# Patient Record
Sex: Female | Born: 1980 | Race: White | Hispanic: No | Marital: Married | Smoking: Never smoker
Health system: Southern US, Community
[De-identification: ages and names within clinical notes are randomized; demographics above are authoritative.]

## PROBLEM LIST (undated history)

## (undated) DIAGNOSIS — R51 Headache: Secondary | ICD-10-CM

## (undated) DIAGNOSIS — Z9889 Other specified postprocedural states: Secondary | ICD-10-CM

## (undated) DIAGNOSIS — Z973 Presence of spectacles and contact lenses: Secondary | ICD-10-CM

## (undated) DIAGNOSIS — B019 Varicella without complication: Secondary | ICD-10-CM

## (undated) DIAGNOSIS — B029 Zoster without complications: Secondary | ICD-10-CM

## (undated) DIAGNOSIS — N83201 Unspecified ovarian cyst, right side: Secondary | ICD-10-CM

## (undated) DIAGNOSIS — R112 Nausea with vomiting, unspecified: Secondary | ICD-10-CM

## (undated) DIAGNOSIS — R87619 Unspecified abnormal cytological findings in specimens from cervix uteri: Secondary | ICD-10-CM

## (undated) DIAGNOSIS — I7300 Raynaud's syndrome without gangrene: Secondary | ICD-10-CM

## (undated) DIAGNOSIS — M1990 Unspecified osteoarthritis, unspecified site: Secondary | ICD-10-CM

## (undated) DIAGNOSIS — R519 Headache, unspecified: Secondary | ICD-10-CM

## (undated) DIAGNOSIS — D649 Anemia, unspecified: Secondary | ICD-10-CM

## (undated) DIAGNOSIS — IMO0002 Reserved for concepts with insufficient information to code with codable children: Secondary | ICD-10-CM

## (undated) DIAGNOSIS — Z8719 Personal history of other diseases of the digestive system: Secondary | ICD-10-CM

## (undated) HISTORY — PX: ADENOIDECTOMY: SUR15

## (undated) HISTORY — DX: Raynaud's syndrome without gangrene: I73.00

## (undated) HISTORY — PX: WISDOM TOOTH EXTRACTION: SHX21

## (undated) HISTORY — PX: ABDOMINAL HYSTERECTOMY: SHX81

## (undated) HISTORY — PX: KNEE SURGERY: SHX244

## (undated) HISTORY — PX: ARTHROSCOPIC REPAIR ACL: SUR80

---
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
## 2002-11-11 ENCOUNTER — Other Ambulatory Visit: Admission: RE | Admit: 2002-11-11 | Discharge: 2002-11-11 | Payer: Self-pay | Admitting: Obstetrics and Gynecology

## 2003-12-19 ENCOUNTER — Other Ambulatory Visit: Admission: RE | Admit: 2003-12-19 | Discharge: 2003-12-19 | Payer: Self-pay | Admitting: Obstetrics and Gynecology

## 2004-12-19 ENCOUNTER — Other Ambulatory Visit: Admission: RE | Admit: 2004-12-19 | Discharge: 2004-12-19 | Payer: Self-pay | Admitting: Obstetrics and Gynecology

## 2005-05-23 ENCOUNTER — Other Ambulatory Visit: Admission: RE | Admit: 2005-05-23 | Discharge: 2005-05-23 | Payer: Self-pay

## 2005-12-20 ENCOUNTER — Other Ambulatory Visit: Admission: RE | Admit: 2005-12-20 | Discharge: 2005-12-20 | Payer: Self-pay | Admitting: Obstetrics and Gynecology

## 2006-03-25 HISTORY — PX: DILATION AND CURETTAGE OF UTERUS: SHX78

## 2006-10-26 ENCOUNTER — Ambulatory Visit (HOSPITAL_COMMUNITY): Admission: RE | Admit: 2006-10-26 | Discharge: 2006-10-26 | Payer: Self-pay | Admitting: Obstetrics and Gynecology

## 2007-01-08 ENCOUNTER — Ambulatory Visit (HOSPITAL_COMMUNITY): Admission: RE | Admit: 2007-01-08 | Discharge: 2007-01-08 | Payer: Self-pay | Admitting: Obstetrics and Gynecology

## 2007-01-08 ENCOUNTER — Encounter (INDEPENDENT_AMBULATORY_CARE_PROVIDER_SITE_OTHER): Payer: Self-pay | Admitting: Obstetrics and Gynecology

## 2007-02-20 ENCOUNTER — Ambulatory Visit (HOSPITAL_COMMUNITY): Admission: RE | Admit: 2007-02-20 | Discharge: 2007-02-20 | Payer: Self-pay | Admitting: Obstetrics and Gynecology

## 2007-05-09 ENCOUNTER — Inpatient Hospital Stay (HOSPITAL_COMMUNITY): Admission: RE | Admit: 2007-05-09 | Discharge: 2007-05-09 | Payer: Self-pay | Admitting: Obstetrics and Gynecology

## 2008-01-13 ENCOUNTER — Inpatient Hospital Stay (HOSPITAL_COMMUNITY): Admission: AD | Admit: 2008-01-13 | Discharge: 2008-01-13 | Payer: Self-pay | Admitting: Obstetrics and Gynecology

## 2008-01-15 ENCOUNTER — Inpatient Hospital Stay (HOSPITAL_COMMUNITY): Admission: AD | Admit: 2008-01-15 | Discharge: 2008-01-18 | Payer: Self-pay | Admitting: Obstetrics and Gynecology

## 2008-05-09 ENCOUNTER — Encounter: Admission: RE | Admit: 2008-05-09 | Discharge: 2008-07-05 | Payer: Self-pay | Admitting: Specialist

## 2010-03-25 HISTORY — PX: BUNIONECTOMY: SHX129

## 2010-04-15 ENCOUNTER — Encounter: Payer: Self-pay | Admitting: Obstetrics and Gynecology

## 2010-04-30 LAB — HEPATITIS B SURFACE ANTIGEN: Hepatitis B Surface Ag: NEGATIVE

## 2010-08-07 NOTE — H&P (Signed)
Cynthia Chase NO.:  [id number]   MEDICAL RECORD NO.:  [id number]          PATIENT TYPE:  MAT   LOCATION:  MATC                          FAC[facility]:  [facility]   PHYSICIAN:  Cynthia Fat. Chase, M.D. DATE OF BIRTH:  03-21-1981   DATE OF ADMISSION:  01/15/2008  DATE OF DISCHARGE:                              HISTORY & PHYSICAL   PRIORITY ADMISSION HISTORY AND PHYSICAL   Cynthia Chase is a 30 year old, gravida 2, para 0-0-1-0, at 21 and 5/7th  weeks, who presents with a gush of fluid at approximately 6 p.m.  tonight, clear fluid, no uterine contractions are reported, she does  report positive fetal movement.  Pregnancy has been remarkable for:  1)  Positive group B Strep; 2)  History of infertility.   PRENATAL LABORATORY DATA:  Blood type is O-positive, Rh antibody  negative, VDRL nonreactive, Rubella titer positive, hepatitis B surface  antigen negative, HIV nonreactive, toxoplasmosis titers were negative-  negative, GC/chlamydia cultures were declined in the first trimester.  Cystic fibrosis testing was declined.  Pap was normal in October of  2008, Hemoglobin upon entry into practice was 13.1; it was 10.8 at 27  weeks.  Patient had first trimester screening that was normal, AFP was  normal.  She had an elevated 1-hour Glucola at 151.  She had a normal 3-  hour GTT. Group B Strep culture was noted to be positive at 36 weeks.  GC/chlamydia cultures were negative at that time.   HISTORY OF PRESENT PREGNANCY:  The patient entered care at approximately  10 weeks.  She had been on Protease until April 12th, which would have  been approximately right at 10 weeks.  She did want first trimester  screening.  This was done and was normal.  She did have some nausea and  vomiting in early pregnancy and was on Phenergan and Zofran.  General  ultrasound at 18 weeks with a normal growth.  She had previously had a  first trimester ultrasound on March 25th that gave an [facility] of January 31, 2008.  The ultrasound at 18 to 19 weeks was in good agreement.  Glucola was elevated at 151, 3-hour GTT was normal.  She had an NST at  31 to 32 weeks for decreased fetal movement.  She had some vaginal pain  at 33 weeks and was seen in the office.  The cervix was closed and long.  She got the seasonal flu vaccine at approximately 36 weeks.  Group B  Strep culture was positive at that time.  GC/chlamydia cultures were  negative.  She was seen in the office earlier this week and was noted to  be 1 cm and approximately 70%.   OBSTETRICAL HISTORY:  The patient in 2008 had a spontaneous miscarriage  in the first trimester.  SHE IS ALLERGIC TO SULFA.  She does have a  history of irregular cycles.   OTHER MEDICAL HISTORY:  1. Both pregnancies were conceived with Femara.  2. She had chickenpox as a child and had shingles in third grade.  3. She had 1 abnormal Pap in 2006.  Followups were normal.  4. She was on Yasmin until July of 2007.   PAST SURGICAL HISTORY:  1. Adenoids in second grade.  2. She had knee surgery at 21 on the right.  3. Left knee surgery as a 10th grader.  4. Wisdom teeth removed at age 72.   FAMILY HISTORY:  Maternal grandmother had coronary artery bypass graft,  paternal grandmother had BT's, maternal grandfather had lung cancer,  paternal grandfather had pancreatic cancer, paternal grandmother had  breast cancer, paternal uncle had lung cancer, and paternal aunt had  breast cancer.   GENETIC HISTORY:  Unremarkable.   SOCIAL HISTORY:  The patient is married to the father of the baby.  He  is involved and supportive.  His name is Tenia Goh.  The patient is  Caucasian.  She denies any religious affiliation.  She has a Bachelor's  degree.  She is an Advertising account planner.  Her husband also has a Bachelor's  degree.  He is a Furniture conservator/restorer at an elementary school.  She has been followed by the [facility] at [facility].  She denies any  alcohol, drug, or tobacco use during this pregnancy.   PHYSICAL EXAMINATION:  VITAL SIGNS:  Stable.  The patient is afebrile.  HEENT:  Within normal limits.  LUNGS:  Breath sounds are clear.  HEART:  Regular rate and rhythm without murmur.  BREASTS:  Soft and nontender.  ABDOMEN:  Fundal height is approximately 38 cm.  Estimated fetal weight  is 6 to 7 pounds.  Uterine contractions are very occasional and mild.  Fetal heart rate is reactive.  PELVIC EXAM:  The patient is noted to be leaking clear fluid.  Positive  fern, positive Nitrazine, and positive pooling is noted.  Cervix is 1+,  70%, vertex at a minus 1 station with forewaters noted.  EXTREMITIES:  Deep tendon reflexes are 2+ without clonus.  There is a  trace edema noted.   IMPRESSION:  1. Intrauterine pregnancy at 52 and 5/7th weeks.  2. Premature rupture of membranes at term.  3. Positive group B Streptococcus.   PLAN:  1. Admit to [facility] for consult with Dr. [name] as attending      physician.  2. Routine physician orders.  3. Plan Group B Strep prophylaxis with penicillin-G per standard      dosing.  4. Dr. [name] will determine further plan of care.      Cynthia Chase, C.N.M.      Cynthia Chase, M.D.  Electronically Signed    VLL/MEDQ  D:  01/15/2008  T:  01/15/2008  Job:  [id number]

## 2010-08-07 NOTE — Op Note (Signed)
Cynthia Chase, Cynthia Chase              ACCOUNT NO.:  [id number]   MEDICAL RECORD NO.:  [id number]          PATIENT TYPE:  AMB   LOCATION:  SDC                           [facility]:  [facility]   PHYSICIAN:  Crist Fat. Rivard, M.D. DATE OF BIRTH:  October 02, 1980   DATE OF PROCEDURE:  01/08/2007  DATE OF DISCHARGE:                               OPERATIVE REPORT   PREOPERATIVE DIAGNOSIS:  Missed abortion at 12 weeks.   POSTOPERATIVE DIAGNOSIS:  Missed abortion at 12 weeks.   ANESTHESIA:  Intravenous sedation, Dr. Malen Gauze, and paracervical block,  Dr. [name].   PROCEDURE:  Dilatation and evacuation.   SURGEON:  Crist Fat. Rivard, MD   ESTIMATED BLOOD LOSS:  Minimal.   PROCEDURE:  After being informed of the planned procedure and possible  complications including bleeding, infection and injury to uterus,  informed consent is obtained.  The patient is taken to OR #3, given IV  sedation and placed in the lithotomy position.  She is prepped and  draped in a sterile fashion and her bladder is emptied with an in-and-  out Foley catheter.  GYN exam reveals a retroverted uterus compatible  with 8-10 weeks' size, mobile, and two normal adnexa.  A weighted  speculum is inserted.  The anterior lip of the cervix is anesthetized  with 1 mL of Nesacaine 1% and then grasped with a tenaculum forceps.  We  then proceed with paracervical block using a total of 18 mL of Nesacaine  1% in the usual fashion.  The uterus is then sounded at 10.5 cm and the  cervix is dilated using Hegar dilator until #33.  During this process,  the tenaculum forceps is changed for a Jacobs forceps to reduce the risk  of laceration.  Using a curved #9 cannula, we proceed with evacuation of  the uterus, which removes a fair amount of products of conception.  After suction is completed, a sharp curette is used to assess the  cavity, which appears to be free of tissue.  Instruments are then  removed instrument, instrument and sponge counts  complete x2.  Estimated  blood loss is minimal and the procedure is very well tolerated by the  patient, who is taken to the recovery room in a well and stable  condition.   SPECIMEN:  Products of conception sent to Pathology.      Crist Fat Rivard, M.D.  Electronically Signed     SAR/MEDQ  D:  01/08/2007  T:  01/09/2007  Job:  [id number]

## 2010-08-12 ENCOUNTER — Inpatient Hospital Stay (HOSPITAL_COMMUNITY): Payer: BC Managed Care – PPO

## 2010-08-12 ENCOUNTER — Inpatient Hospital Stay (HOSPITAL_COMMUNITY)
Admission: AD | Admit: 2010-08-12 | Discharge: 2010-08-12 | Disposition: A | Discharge status: Home / Self Care | Payer: BC Managed Care – PPO | Source: Ambulatory Visit | Attending: Obstetrics and Gynecology | Admitting: Obstetrics and Gynecology

## 2010-08-12 DIAGNOSIS — O4690 Antepartum hemorrhage, unspecified, unspecified trimester: Secondary | ICD-10-CM | POA: Insufficient documentation

## 2010-08-12 LAB — URINE MICROSCOPIC-ADD ON

## 2010-08-12 LAB — URINALYSIS, ROUTINE W REFLEX MICROSCOPIC
Bilirubin Urine: NEGATIVE
Leukocytes, UA: NEGATIVE
Nitrite: NEGATIVE
Protein, ur: NEGATIVE mg/dL
Specific Gravity, Urine: 1.01 (ref 1.005–1.030)
Urobilinogen, UA: 0.2 mg/dL (ref 0.0–1.0)

## 2010-08-13 ENCOUNTER — Inpatient Hospital Stay (HOSPITAL_COMMUNITY): Payer: BC Managed Care – PPO

## 2010-08-13 ENCOUNTER — Inpatient Hospital Stay (HOSPITAL_COMMUNITY)
Admission: AD | Admit: 2010-08-13 | Discharge: 2010-08-18 | DRG: 886 | Disposition: A | Discharge status: Home / Self Care | Payer: BC Managed Care – PPO | Source: Ambulatory Visit | Attending: Obstetrics and Gynecology | Admitting: Obstetrics and Gynecology

## 2010-08-13 DIAGNOSIS — O4690 Antepartum hemorrhage, unspecified, unspecified trimester: Secondary | ICD-10-CM | POA: Diagnosis present

## 2010-08-13 DIAGNOSIS — O209 Hemorrhage in early pregnancy, unspecified: Secondary | ICD-10-CM

## 2010-08-13 DIAGNOSIS — O4590 Premature separation of placenta, unspecified, unspecified trimester: Principal | ICD-10-CM | POA: Diagnosis present

## 2010-08-13 LAB — WET PREP, GENITAL
Clue Cells Wet Prep HPF POC: NONE SEEN
Trich, Wet Prep: NONE SEEN
Yeast Wet Prep HPF POC: NONE SEEN

## 2010-08-13 LAB — URINE MICROSCOPIC-ADD ON

## 2010-08-13 LAB — URINALYSIS, ROUTINE W REFLEX MICROSCOPIC
Ketones, ur: NEGATIVE mg/dL
Leukocytes, UA: NEGATIVE

## 2010-08-13 LAB — CBC
Hemoglobin: 11.8 g/dL — ABNORMAL LOW (ref 12.0–15.0)
RDW: 12.4 % (ref 11.5–15.5)
WBC: 11.3 10*3/uL — ABNORMAL HIGH (ref 4.0–10.5)

## 2010-08-14 LAB — GC/CHLAMYDIA PROBE AMP, GENITAL
Chlamydia, DNA Probe: NEGATIVE
GC Probe Amp, Genital: NEGATIVE

## 2010-08-15 ENCOUNTER — Observation Stay (HOSPITAL_COMMUNITY)
Admit: 2010-08-15 | Discharge: 2010-08-15 | Disposition: A | Discharge status: Home / Self Care | Payer: BC Managed Care – PPO | Attending: Obstetrics and Gynecology | Admitting: Obstetrics and Gynecology

## 2010-08-17 LAB — COMPREHENSIVE METABOLIC PANEL
AST: 11 U/L (ref 0–37)
BUN: 8 mg/dL (ref 6–23)
CO2: 26 mEq/L (ref 19–32)
Calcium: 9.5 mg/dL (ref 8.4–10.5)
Chloride: 97 mEq/L (ref 96–112)
Glucose, Bld: 103 mg/dL — ABNORMAL HIGH (ref 70–99)
Potassium: 3.3 mEq/L — ABNORMAL LOW (ref 3.5–5.1)
Total Protein: 6.6 g/dL (ref 6.0–8.3)

## 2010-09-03 NOTE — Discharge Summary (Signed)
NAMEMORGIN, HALLS NO.:  [id number]  MEDICAL RECORD NO.:  [id number]           PATIENT TYPE:  I  LOCATION:  9152                          [facility]:  [facility]  PHYSICIAN:  Janine Limbo, M.D.DATE OF BIRTH:  10/25/1980  DATE OF ADMISSION:  08/13/2010 DATE OF DISCHARGE:  08/18/2010                              DISCHARGE SUMMARY   ADMITTING DIAGNOSES: 1. 25-week singleton intrauterine pregnancy 2. Vaginal bleeding of unknown etiology  3. Suspected Marginal Abruption 4. Fetal heart rate category 1  DISCHARGE DIAGNOSES: 1. 25-week singleton intrauterine pregnancy 2. Vaginal bleeding of unknown etiology  3. Suspected Marginal Abruption 4. Fetal heart rate category 1   HOSPITAL PROCEDURES:  Ultrasound and Maternal Fetal Medicine consult.  HOSPITAL COURSE:  Ms. Bayless presented to the MAU on Aug 13, 2010, at 8:20pm with chief complaints of a gush of purple-red blood late in the afternoon, which saturated her underwear and clothes.  She had been evaluated for vaginal bleeding the previous day in the MAU with similar complaints of a gush that had tapered as the day progressed to intermittent spotting.  After leaving MAU on the previous day, she had intermittent spotting through the night and then all through the day until she had a gush in the afternoon and called the office where she was instructed to be seen in the MAU. She also reported an occasional contractions since Friday, very mild lower abdominal pressure, and some upper abdominal tightening.  She had no fever, no illness, no respiratory complaints, no urinary tract infection signs or symptoms or PIH signs or symptoms.  On exam she was found to have a fair amount of bright red vaginal bleeding in the vaginal vault.  Swabs were used to clear the vault to visualize the cervix, and the cervix was noted to have small trickling of active vaginal bleeding.  At that point, she was admitted to the antepartum unit  for a  complete OB Ultrasound, IV fluids, a wet prep, cultures, continuous fetal monitoring, pad counts, and to remain NPO status. At 2330 Dr. [name] reviewed her ultrasound and found it to be essentially  normal, but could be consistent, clinically, with a marginal abruption.  Findings were reviewed with the patient at that time as well as the plan of care.  She was admitted fully to the AP unit and betamethasone was ordered q.12 hrs IM as well as continuous fetal monitoring.  On the morning of Aug 14, 2010, she was noted to be resting comfortably in bed.  She denied any abdominal pain or discomfort.  Her H and H was stable at 13.6 and 40.6.  She had no active bleeding x12 hours, had a pad on which had some small brown discharge on it.  She was afebrile.  Her abdomen was soft and nontender.  Fetal heart rate was category 1 in 140s, reactive, with a very active baby and we continued to monitor her closely.  She was status post one dose of betamethasone at that time due for a second dose at 12:35 that day.    On the morning of Aug 15, 2010, she was  still found to be doing well.  She had had a small amount of discharge after a bowel movement, but no active bleeding.  Her Vital signs were stable and she remained afebrile.  The Fetal heart rate was appropriate for gestational age of [redacted] weeks. A Maternal Fetal Medicine consult was planned to check a repeat ultrasound.  On the afternoon of May 23rd,  Maternal Fetal Medicine reviewed the Ultrasound and found no abnormal findings.  They recommended in-hospital care for 4-7 days  and if no active vaginal bleeding during that time  potential discharge home. At that time, she was deemed stable enough for  intermittent monitoring and potential discharge to home after 4 days with no vaginal bleeding.    On Aug 16, 2010, she was doing well.  She had had one contraction on the monitor. Her abdomen was soft and nontender.  She had no active vaginal bleeding,  and was getting an NST every shift that looked appropriate for gestational age and reactive.   On the morning of Aug 17, 2010, she was tolerating a regular diet, afebrile with stable Vital signs and her Fetal heart rate, NST, was category 1 with no decels, no contractions, and no active vaginal bleeding. She did desire discharge home.  She was getting wheelchairs ride down 2 times a day.  Dr. Pennie Rushing recommended to check a CMP to rule out any signs of PIH because of the abruption and that was done.    On the morning of Aug 18, 2010, she was 25 weeks and 5 days.  She denied any vaginal bleeding or discharge, loss of fluid, or cramps.  Her fetal monitor tracing was reactive, appropriate for gestational age at 76 weeks.  Her CMP was essentially normal.  SGOT/SGPT were 11 and 9.  She was having no problems and strongly desired discharge home. At that time Dr. Stefano Gaul, deemed her stable enough and per MFM recommendations, that it was appropriate for her to be discharged home.    She was discharged home, on strict bedrest, in stable condition, with instructions to call for any bleeding and to return to [facility] in 1 week    ______________________________ Anabel Halon, [facility], CNM   ______________________________ Janine Limbo, M.D.    DT/MEDQ  D:  08/18/2010  T:  08/19/2010  Job:  [id number]  Electronically Signed by Anabel Halon CNM on 08/23/2010 05:07:32 PM Electronically Signed by Kirkland Hun M.D. on 09/03/2010 03:48:02 PM

## 2010-09-28 LAB — STREP B DNA PROBE: GBS: POSITIVE

## 2010-11-16 ENCOUNTER — Encounter (HOSPITAL_COMMUNITY): Payer: Self-pay

## 2010-11-16 ENCOUNTER — Inpatient Hospital Stay (HOSPITAL_COMMUNITY)
Admission: AD | Admit: 2010-11-16 | Discharge: 2010-11-16 | Disposition: A | Discharge status: Home / Self Care | Payer: BC Managed Care – PPO | Source: Ambulatory Visit | Attending: Obstetrics and Gynecology | Admitting: Obstetrics and Gynecology

## 2010-11-16 DIAGNOSIS — O479 False labor, unspecified: Secondary | ICD-10-CM | POA: Insufficient documentation

## 2010-11-16 HISTORY — DX: Unspecified abnormal cytological findings in specimens from cervix uteri: R87.619

## 2010-11-16 HISTORY — DX: Reserved for concepts with insufficient information to code with codable children: IMO0002

## 2010-11-16 HISTORY — DX: Zoster without complications: B02.9

## 2010-11-16 HISTORY — DX: Varicella without complication: B01.9

## 2010-11-16 NOTE — Progress Notes (Signed)
Patient states that she was 3cm this week. She is here for a labor check, ctx q30m. Denies any vaginal bleeding, lof or discharge. Reports good fetal movement.

## 2010-11-16 NOTE — Progress Notes (Signed)
Anabel Halon cnm notified of patient, order to check cervix and if still 3cm, patient may ambulate for an hour and rechecked.

## 2010-11-16 NOTE — ED Provider Notes (Signed)
Cynthia Chase is a 30 y.o. female presenting for onset of ctx. Denies VB, or LOF, reports +FM.  Maternal Medical History:  Reason for admission: Reason for admission: contractions.  Contractions: Onset was 6-12 hours ago.   Frequency: irregular.   Perceived severity is mild.    Fetal activity: Perceived fetal activity is normal.   Last perceived fetal movement was within the past hour.      OB History    Grav Para Term Preterm Abortions TAB SAB Ect Mult Living   3 1 1  1  1   1      Past Medical History  Diagnosis Date  . Shingles     3rd and 4th grade  . Varicella     as a child  . Abnormal Pap smear     2006   Past Surgical History  Procedure Date  . Knee surgery   . Dilation and curettage of uterus   . Arthroscopic repair acl   . Wisdom tooth extraction    Family History: family history includes Cancer in her maternal grandfather and mother and Hyperlipidemia in her mother. Social History:  does not have a smoking history on file. She does not have any smokeless tobacco history on file. Her alcohol and drug histories not on file.  Review of Systems  All other systems reviewed and are negative.    Dilation: 2.5 Effacement (%): 60 Station: -3 Exam by:: Peace, rn Blood pressure 122/75, pulse 120, temperature 98.3 F (36.8 C), temperature source Oral, resp. rate 18, height 5\' 8"  (1.727 m), weight 87.998 kg (194 lb). Maternal Exam:  Uterine Assessment: Contraction strength is mild.  Contraction frequency is irregular.   Abdomen: Patient reports no abdominal tenderness. Fetal presentation: vertex  Cervix: Cervix evaluated by digital exam.     Fetal Exam Fetal Monitor Review: Mode: ultrasound.   Baseline rate: 150.  Variability: moderate (6-25 bpm).   Pattern: accelerations present and no decelerations.    Fetal State Assessment: Category I - tracings are normal.     Physical Exam  Nursing note and vitals reviewed. Constitutional: She is oriented to  person, place, and time. She appears well-developed and well-nourished.  GI: Soft.  Genitourinary: Vagina normal and uterus normal.  Neurological: She is alert and oriented to person, place, and time.  Skin: Skin is warm and dry.    Prenatal labs: ABO, Rh: O/Positive/-- (02/06 0000) Antibody: Negative (02/06 0000) Rubella:   RPR:    HBsAg: Negative (02/06 0000)  HIV: Non-reactive (02/06 0000)  GBS: Positive (07/06 0000)   Assessment/Plan: After walking, no change in ctx pattern, pt reports some spotting and mucous discharge.  No cervical change No active labor FHR reassuing Pt to f/u at [facility] as scheduled.  D/C home  Cynthia Chase M 11/16/2010, 11:05 PM

## 2010-11-22 ENCOUNTER — Inpatient Hospital Stay (HOSPITAL_COMMUNITY)
Admission: AD | Admit: 2010-11-22 | Discharge: 2010-11-24 | DRG: 373 | Disposition: A | Discharge status: Home / Self Care | Payer: BC Managed Care – PPO | Source: Ambulatory Visit | Attending: Obstetrics and Gynecology | Admitting: Obstetrics and Gynecology

## 2010-11-22 ENCOUNTER — Other Ambulatory Visit: Payer: Self-pay | Admitting: Obstetrics and Gynecology

## 2010-11-22 DIAGNOSIS — Z3490 Encounter for supervision of normal pregnancy, unspecified, unspecified trimester: Secondary | ICD-10-CM

## 2010-11-22 DIAGNOSIS — Z22330 Carrier of Group B streptococcus: Secondary | ICD-10-CM

## 2010-11-22 DIAGNOSIS — O99892 Other specified diseases and conditions complicating childbirth: Secondary | ICD-10-CM | POA: Diagnosis present

## 2010-11-22 DIAGNOSIS — IMO0001 Reserved for inherently not codable concepts without codable children: Secondary | ICD-10-CM

## 2010-11-22 DIAGNOSIS — O99820 Streptococcus B carrier state complicating pregnancy: Secondary | ICD-10-CM

## 2010-11-22 DIAGNOSIS — O4700 False labor before 37 completed weeks of gestation, unspecified trimester: Secondary | ICD-10-CM

## 2010-11-22 NOTE — Progress Notes (Signed)
Contractions since 0330, got more uncomfortable tonight. G3P1

## 2010-11-23 ENCOUNTER — Encounter (HOSPITAL_COMMUNITY): Payer: Self-pay | Admitting: Anesthesiology

## 2010-11-23 ENCOUNTER — Inpatient Hospital Stay (HOSPITAL_COMMUNITY): Payer: BC Managed Care – PPO | Admitting: Anesthesiology

## 2010-11-23 DIAGNOSIS — Z3490 Encounter for supervision of normal pregnancy, unspecified, unspecified trimester: Secondary | ICD-10-CM

## 2010-11-23 LAB — CBC
MCH: 33.2 pg (ref 26.0–34.0)
MCV: 94 fL (ref 78.0–100.0)
Platelets: 177 10*3/uL (ref 150–400)
RBC: 3.82 MIL/uL — ABNORMAL LOW (ref 3.87–5.11)

## 2010-11-23 MED ORDER — PHENYLEPHRINE 40 MCG/ML (10ML) SYRINGE FOR IV PUSH (FOR BLOOD PRESSURE SUPPORT)
80.0000 ug | PREFILLED_SYRINGE | INTRAVENOUS | Status: DC | PRN
  Filled 2010-11-23 (×2): qty 5

## 2010-11-23 MED ORDER — ACETAMINOPHEN 325 MG PO TABS: 650.0000 mg | ORAL_TABLET | ORAL | Status: DC | PRN

## 2010-11-23 MED ORDER — NALBUPHINE SYRINGE 5 MG/0.5 ML
5.0000 mg | INJECTION | INTRAMUSCULAR | Status: DC | PRN
  Filled 2010-11-23: qty 0.5

## 2010-11-23 MED ORDER — BENZOCAINE-MENTHOL 20-0.5 % EX AERO
1.0000 "application " | INHALATION_SPRAY | CUTANEOUS | Status: DC | PRN
  Administered 2010-11-23: 1 via TOPICAL

## 2010-11-23 MED ORDER — OXYTOCIN 20 UNITS IN LACTATED RINGERS INFUSION - SIMPLE
1.0000 m[IU]/min | INTRAVENOUS | Status: DC
  Administered 2010-11-23: 2 m[IU]/min via INTRAVENOUS
  Filled 2010-11-23: qty 1000

## 2010-11-23 MED ORDER — TERBUTALINE SULFATE 1 MG/ML IJ SOLN: 0.2500 mg | Freq: Once | INTRAMUSCULAR | Status: AC | PRN

## 2010-11-23 MED ORDER — SENNOSIDES-DOCUSATE SODIUM 8.6-50 MG PO TABS
2.0000 | ORAL_TABLET | Freq: Every day | ORAL | Status: DC
  Administered 2010-11-23: 2 via ORAL

## 2010-11-23 MED ORDER — ONDANSETRON HCL 4 MG PO TABS: 4.0000 mg | ORAL_TABLET | ORAL | Status: DC | PRN

## 2010-11-23 MED ORDER — PRENATAL PLUS 27-1 MG PO TABS
1.0000 | ORAL_TABLET | Freq: Every day | ORAL | Status: DC
  Administered 2010-11-24: 1 via ORAL
  Filled 2010-11-23: qty 1

## 2010-11-23 MED ORDER — LACTATED RINGERS IV SOLN: 500.0000 mL | INTRAVENOUS | Status: DC | PRN

## 2010-11-23 MED ORDER — FENTANYL 2.5 MCG/ML BUPIVACAINE 1/10 % EPIDURAL INFUSION (WH - ANES)
14.0000 mL/h | INTRAMUSCULAR | Status: DC
  Administered 2010-11-23 (×2): 14 mL/h via EPIDURAL
  Filled 2010-11-23 (×2): qty 60

## 2010-11-23 MED ORDER — LIDOCAINE HCL (PF) 1 % IJ SOLN
30.0000 mL | INTRAMUSCULAR | Status: DC | PRN
  Filled 2010-11-23 (×2): qty 30

## 2010-11-23 MED ORDER — CITRIC ACID-SODIUM CITRATE 334-500 MG/5ML PO SOLN
30.0000 mL | ORAL | Status: DC | PRN
  Administered 2010-11-23: 30 mL via ORAL
  Filled 2010-11-23: qty 15

## 2010-11-23 MED ORDER — PENICILLIN G POTASSIUM 5000000 UNITS IJ SOLR
5.0000 10*6.[IU] | Freq: Once | INTRAVENOUS | Status: DC
  Administered 2010-11-23: 5 10*6.[IU] via INTRAVENOUS
  Filled 2010-11-23: qty 5

## 2010-11-23 MED ORDER — FLEET ENEMA 7-19 GM/118ML RE ENEM: 1.0000 | ENEMA | RECTAL | Status: DC | PRN

## 2010-11-23 MED ORDER — EPHEDRINE 5 MG/ML INJ
10.0000 mg | INTRAVENOUS | Status: DC | PRN
  Filled 2010-11-23 (×2): qty 4

## 2010-11-23 MED ORDER — IBUPROFEN 600 MG PO TABS
600.0000 mg | ORAL_TABLET | Freq: Four times a day (QID) | ORAL | Status: DC
  Administered 2010-11-23 – 2010-11-24 (×4): 600 mg via ORAL
  Filled 2010-11-23 (×2): qty 1

## 2010-11-23 MED ORDER — BENZOCAINE-MENTHOL 20-0.5 % EX AERO
INHALATION_SPRAY | CUTANEOUS | Status: AC
  Administered 2010-11-23: 1 via TOPICAL
  Filled 2010-11-23: qty 56

## 2010-11-23 MED ORDER — SIMETHICONE 80 MG PO CHEW: 80.0000 mg | CHEWABLE_TABLET | ORAL | Status: DC | PRN

## 2010-11-23 MED ORDER — DIPHENHYDRAMINE HCL 50 MG/ML IJ SOLN: 12.5000 mg | INTRAMUSCULAR | Status: DC | PRN

## 2010-11-23 MED ORDER — OXYCODONE-ACETAMINOPHEN 5-325 MG PO TABS
1.0000 | ORAL_TABLET | ORAL | Status: DC | PRN
  Administered 2010-11-23 – 2010-11-24 (×2): 1 via ORAL
  Filled 2010-11-23: qty 1

## 2010-11-23 MED ORDER — LACTATED RINGERS IV SOLN
500.0000 mL | Freq: Once | INTRAVENOUS | Status: AC
  Administered 2010-11-23: 500 mL via INTRAVENOUS

## 2010-11-23 MED ORDER — OXYTOCIN BOLUS FROM INFUSION
500.0000 mL | Freq: Once | INTRAVENOUS | Status: DC
  Filled 2010-11-23: qty 500

## 2010-11-23 MED ORDER — EPHEDRINE 5 MG/ML INJ
10.0000 mg | INTRAVENOUS | Status: DC | PRN
  Filled 2010-11-23: qty 4

## 2010-11-23 MED ORDER — OXYTOCIN 20 UNITS IN LACTATED RINGERS INFUSION - SIMPLE
125.0000 mL/h | Freq: Once | INTRAVENOUS | Status: DC
  Administered 2010-11-23: 500 mL/h via INTRAVENOUS

## 2010-11-23 MED ORDER — ONDANSETRON HCL 4 MG/2ML IJ SOLN: 4.0000 mg | Freq: Four times a day (QID) | INTRAMUSCULAR | Status: DC | PRN

## 2010-11-23 MED ORDER — ZOLPIDEM TARTRATE 5 MG PO TABS: 5.0000 mg | ORAL_TABLET | Freq: Every evening | ORAL | Status: DC | PRN

## 2010-11-23 MED ORDER — LANOLIN HYDROUS EX OINT: TOPICAL_OINTMENT | CUTANEOUS | Status: DC | PRN

## 2010-11-23 MED ORDER — PENICILLIN G POTASSIUM 5000000 UNITS IJ SOLR
2.5000 10*6.[IU] | INTRAMUSCULAR | Status: DC
  Administered 2010-11-23: 2.5 10*6.[IU] via INTRAVENOUS
  Filled 2010-11-23 (×9): qty 2.5

## 2010-11-23 MED ORDER — DIPHENHYDRAMINE HCL 25 MG PO CAPS: 25.0000 mg | ORAL_CAPSULE | Freq: Four times a day (QID) | ORAL | Status: DC | PRN

## 2010-11-23 MED ORDER — LACTATED RINGERS IV SOLN
INTRAVENOUS | Status: DC
  Administered 2010-11-23 (×2): via INTRAVENOUS

## 2010-11-23 MED ORDER — IBUPROFEN 600 MG PO TABS
600.0000 mg | ORAL_TABLET | Freq: Four times a day (QID) | ORAL | Status: DC | PRN
  Administered 2010-11-23: 600 mg via ORAL
  Filled 2010-11-23 (×3): qty 1

## 2010-11-23 MED ORDER — DIBUCAINE 1 % RE OINT: 1.0000 "application " | TOPICAL_OINTMENT | RECTAL | Status: DC | PRN

## 2010-11-23 MED ORDER — OXYCODONE-ACETAMINOPHEN 5-325 MG PO TABS
2.0000 | ORAL_TABLET | ORAL | Status: DC | PRN
  Filled 2010-11-23: qty 1

## 2010-11-23 MED ORDER — TETANUS-DIPHTH-ACELL PERTUSSIS 5-2.5-18.5 LF-MCG/0.5 IM SUSP
0.5000 mL | Freq: Once | INTRAMUSCULAR | Status: AC
  Administered 2010-11-24: 0.5 mL via INTRAMUSCULAR
  Filled 2010-11-23: qty 0.5

## 2010-11-23 MED ORDER — MEASLES, MUMPS & RUBELLA VAC ~~LOC~~ INJ: 0.5000 mL | INJECTION | Freq: Once | SUBCUTANEOUS | Status: DC

## 2010-11-23 MED ORDER — WITCH HAZEL-GLYCERIN EX PADS: 1.0000 "application " | MEDICATED_PAD | CUTANEOUS | Status: DC | PRN

## 2010-11-23 MED ORDER — PHENYLEPHRINE 40 MCG/ML (10ML) SYRINGE FOR IV PUSH (FOR BLOOD PRESSURE SUPPORT)
80.0000 ug | PREFILLED_SYRINGE | INTRAVENOUS | Status: DC | PRN
  Filled 2010-11-23: qty 5

## 2010-11-23 MED ORDER — BISACODYL 10 MG RE SUPP: 10.0000 mg | Freq: Every day | RECTAL | Status: DC | PRN

## 2010-11-23 NOTE — Progress Notes (Signed)
Cynthia Chase is a 30 y.o. G3P1011 at [redacted]w[redacted]d by LMP admitted for active labor  Subjective: Comfortable with epidural.  Feeling mild pressure  Objective: BP 125/79  Pulse 95  Temp(Src) 98.1 F (36.7 C) (Oral)  Resp 18  Ht 5\' 8"  (1.727 m)  Wt 195 lb (88.451 kg)  BMI 29.65 kg/m2  SpO2 96%      FHT:  FHR: 150 bpm, variability: moderate,  accelerations:  Present,  decelerations:  Absent UC:   regular, every 2-3 minutes SVE:   Dilation: Lip/rim Effacement (%): 80 Station: 0 Exam by:: haygood  Labs: Lab Results  Component Value Date   WBC 13.6* 11/23/2010   HGB 12.7 11/23/2010   HCT 35.9* 11/23/2010   MCV 94.0 11/23/2010   PLT 177 11/23/2010    Assessment / Plan: Spontaneous labor, progressing normally  Labor: Progressing normally Preeclampsia:  no signs or symptoms of toxicity Fetal Wellbeing:  Category I Pain Control:  Epidural I/D:  n/a Anticipated MOD:  NSVD  HAYGOOD,VANESSA P 11/23/2010, 9:00 AM

## 2010-11-23 NOTE — Anesthesia Preprocedure Evaluation (Signed)
Anesthesia Evaluation  Name, MR# and DOB Patient awake  General Assessment Comment  Reviewed: Allergy & Precautions, H&P , Patient's Chart, lab work & pertinent test results  Airway Mallampati: II TM Distance: >3 FB Neck ROM: full    Dental  (+) Teeth Intact   Pulmonary  clear to auscultation  breath sounds clear to auscultation none    Cardiovascular regular Normal    Neuro/Psych   GI/Hepatic/Renal   Endo/Other    Abdominal   Musculoskeletal   Hematology   Peds  Reproductive/Obstetrics (+) Pregnancy    Anesthesia Other Findings                 Anesthesia Physical Anesthesia Plan  ASA: II  Anesthesia Plan: Epidural   Post-op Pain Management:    Induction:   Airway Management Planned:   Additional Equipment:   Intra-op Plan:   Post-operative Plan:   Informed Consent:   Plan Discussed with:   Anesthesia Plan Comments:         Anesthesia Quick Evaluation  

## 2010-11-23 NOTE — H&P (Signed)
Cynthia Chase is a 30 y.o. female G3P1 at 54 4/7 weeks presenting initially with c/o UCs q 8-10 minutes x several hours, moderately painful.  Cervix was 3 cm in the office yesterday.  Denies leaking or bleeding, reports +FM. Cervix was initially 3-4, then changed to 5, BBOW, with more frequent UCs q 5 minutes after ambulation.  Pregnancy remarkable for: Positive GBS Hx infertility Irregular cycles Hx of marginal abruption 5/12 at 25-26 weeks, with resolution on [place] by 27 weeks  History of present pregnancy: Entered care at 10 weeks, after spontaneous conception.  1st trimester screen and AFP were normal.  6 week U/S gave EDC of 11/26/10.  Anatomy scan at 18 weeks showed normal findings, posterior placenta, and normal cervical length.  Had sudden onset of vaginal bleeding at 25 weeks, and was hospitalized 5/21-5/26 with diagnosis of marginal abruption.  Betamethasone was given during that hospitalization.  Repeat US at 27 weeks showed no evidence of the abruption, with normal fetal growth and fluid, with an anterior placenta.  Repeat US at 31 weeks was also WNL.  GBS was positive at 36 weeks.     OB History    Grav Para Term Preterm Abortions TAB SAB Ect Mult Living   3 1 1  1  1   1     Pregnancy #1:  2008 SAB with D&E Pregnancy #2:  2009 SVB at 37 6/7 weeks, wt 6+9, epidural.  PROM at term  Past Medical History  Diagnosis Date  . Shingles     3rd and 4th grade  . Varicella     as a child  . Abnormal Pap smear     2006  Conceived on Femara last pregnancy, spontaneous this pregnancy.  Past Surgical History  Procedure Date  . Knee surgery   . Dilation and curettage of uterus   . Arthroscopic repair acl   . Wisdom tooth extraction    Family History: family history includes Cancer in her maternal grandfather and mother and Hyperlipidemia in her mother.  MGF lung, carcinoid; PGF pancreatic; PGM breast; P. Uncle lung; PA breast  Social History:  does not have a smoking history on  file. She does not have any smokeless tobacco history on file. Her alcohol and drug histories not on file.  Married to FOB--he is present and supportive.  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  FOB is college educated and works as a Optometrist.  Patient is caucasian, denies a religious affiliation.   Initial Exam: Dilation: 3.5 (3-4cm) Effacement (%): 70 (75) Station: -1 Exam by:: Nigel Bridgeman cnm  Blood pressure 130/85, pulse 90, temperature 98.5 F (36.9 C), temperature source Oral, resp. rate 18, height 5\' 8"  (1.727 m), weight 88.451 kg (195 lb).  After ambulation, cervix was 5 cm, 80%, vtx, -1, BBOW. FHR reactive, UCs now q 5 minutes.  Prenatal labs: ABO, Rh: O/Positive/-- (02/06 0000) Antibody: Negative (02/06 0000) Rubella:  Immune RPR:   NR HBsAg: Negative (02/06 0000)  HIV: Non-reactive (02/06 0000)  GBS: Positive (07/06 0000)  Normal 1 hr GTT Normal Hgb at NOB and at 28 weeks Ist trimester screen and AFP WNL. Assessment/Plan: IUP at 39 4/7 weeks Active labor Positive GBS  Plan: Admit to Birthing Suite per consult with Dr. Su Hilt Routine MD orders GBS Rx with PCN G per standard dosing Pt plans epidural  LATHAM,VICKI L 11/23/2010, 12:52 AM

## 2010-11-23 NOTE — Op Note (Signed)
Delivery Note At 9:59 AM a viable female was delivered via Vaginal, Spontaneous Delivery (Presentation: Left Occiput Anterior).  APGAR: 9, 9; weight 8 lb 6 oz (3800 g).   Placenta status: Intact, Spontaneous.  Cord: 3 vessels with the following complications:margina insertion .  Cord pH: n/a  Anesthesia: Epidural  Episiotomy: none Lacerations: 1st degree Suture Repair: 2.0 vicryl Est. Blood Loss (mL):   Mom to postpartum.  Baby to nursery-stable Cord blood collected for CCBB.  HAYGOOD,VANESSA P 11/23/2010, 1:02 PM

## 2010-11-23 NOTE — Anesthesia Procedure Notes (Signed)
Epidural Patient location during procedure: OB Start time: 11/23/2010 3:55 AM  Staffing Anesthesiologist: Jiles Garter  Preanesthetic Checklist Completed: patient identified, site marked, surgical consent, pre-op evaluation, timeout performed, IV checked, risks and benefits discussed and monitors and equipment checked  Epidural Patient position: sitting Prep: site prepped and draped and DuraPrep Patient monitoring: continuous pulse ox and blood pressure Approach: midline Injection technique: LOR air  Needle:  Needle type: Tuohy  Needle gauge: 17 G Needle length: 9 cm Needle insertion depth: 6 cm Catheter type: closed end flexible Catheter size: 19 Gauge Catheter at skin depth: 12 cm Test dose: negative  Assessment Events: blood not aspirated, injection not painful, no injection resistance, negative IV test and no paresthesia  Additional Notes Dosing of Epidural: 1st dose, Through needle...... 5mg  Marcaine 2nd dose, through catheter.... epi 1:200K + Xylocaine 40 mg 3rd dose, through catheter...Marland KitchenMarland Kitchenepi 1:200K + Xylocaine 60 mg Each dose occurred after waiting 3 min,patient was free of IV sx; and patient exhibits no evidence of SA injection  Patient is more comfortable after epidural dosed. Please see RN's note for documentation of vital signs,and FHR which are stable.

## 2010-11-23 NOTE — Anesthesia Postprocedure Evaluation (Signed)
  Anesthesia Post-op Note  Patient: Cynthia Chase  Procedure(s) Performed: * No procedures listed *  Patient Location: PACU and Mother/Baby  Anesthesia Type: Epidural  Level of Consciousness: awake, alert  and oriented  Airway and Oxygen Therapy: Patient Spontanous Breathing  Post-op Pain: mild  Post-op Assessment: Patient's Cardiovascular Status Stable, Respiratory Function Stable, Pain level controlled, No headache, No backache, No residual numbness and No residual motor weakness  Post-op Vital Signs: stable  Complications: No apparent anesthesia complications

## 2010-11-24 LAB — CBC
HCT: 33.6 % — ABNORMAL LOW (ref 36.0–46.0)
Hemoglobin: 11.7 g/dL — ABNORMAL LOW (ref 12.0–15.0)
MCH: 33.1 pg (ref 26.0–34.0)
MCHC: 34.8 g/dL (ref 30.0–36.0)
MCV: 94.9 fL (ref 78.0–100.0)
RBC: 3.54 MIL/uL — ABNORMAL LOW (ref 3.87–5.11)

## 2010-11-24 MED ORDER — IBUPROFEN 600 MG PO TABS: 600.0000 mg | ORAL_TABLET | Freq: Four times a day (QID) | ORAL | Status: AC

## 2010-11-24 NOTE — Progress Notes (Signed)
Post Partum Day 1 Subjective: no complaints, up ad lib, voiding, tolerating PO, + flatus and breastfeeding; vb stable; desires early d/c today.  Planning beach trip last week of this month.  Pain controlled with Motrin and Percocet, but feels will do fine with Motrin and Tylenol after d/c.  Recall vulvar irritation about 2 wks pp after last delivery and dr. [name] felt "pads" may have irritated skin and was rx'd topical cream--pt to observe for recurrence.  Objective: Blood pressure 119/91, pulse 88, temperature 97.8 F (36.6 C), temperature source Oral, resp. rate 18, height 5\' 8"  (1.727 m), weight 88.451 kg (195 lb), SpO2 96.00%.  Physical Exam:  General: alert, cooperative and no distress Lochia: appropriate Uterine Fundus: firm Incision: n/a DVT Evaluation: No evidence of DVT seen on physical exam. Negative Homan's sign. No significant calf/ankle edema.   Basename 11/24/10 0547 11/23/10 0230  HGB 11.7* 12.7  HCT 33.6* 35.9*    Assessment/Plan: Discharge home, Breastfeeding and Contraception --husband already had vasectomy D/c instructions per [facility] pamphlet--warning s/s to report rev'd. Rx for Motrin; continue PNV; May stagger in ES Tylenol prn btwn Motrin doses. F/u at [facility] in 4-6 wks or prn.  LOS: 2 days   Rudransh Bellanca H 11/24/2010, 10:40 AM

## 2010-11-24 NOTE — Discharge Summary (Signed)
Obstetric Discharge Summary Reason for Admission: onset of labor Prenatal Procedures: NST, ultrasound and Bedrest until mid-late 3rd trimester secondary to 2nd trimester vaginal bleeding Intrapartum Procedures: spontaneous vaginal delivery and GBS prophylaxis Postpartum Procedures: none Complications-Operative and Postpartum: 1st degree perineal laceration Hemoglobin  Date Value Range Status  11/24/2010 11.7* 12.0-15.0 (g/dL) Final     HCT  Date Value Range Status  11/24/2010 33.6* 36.0-46.0 (%) Final    Discharge Diagnoses: Term Pregnancy-delivered and Antepartum bleeding  Discharge Information: Date: 11/24/2010 Activity: pelvic rest Diet: routine Medications: PNV, Ibuprophen and Tylenol Condition: stable Instructions: refer to practice specific booklet Discharge to: home Follow-up Information    Follow up with [facility]. Make an appointment in 5 weeks. (or follow-up as needed if symptoms worsen)          Newborn Data: Live born female "Grayce Sessions" Birth Weight: 8 lb 6 oz (3800 g) APGAR: 9, 9  Home with mother. Husband has already had vasectomy.  STEELMAN,CANDICE H 11/24/2010, 10:38 AM

## 2010-11-27 ENCOUNTER — Encounter (HOSPITAL_COMMUNITY): Payer: Self-pay | Admitting: *Deleted

## 2010-12-24 LAB — CBC
HCT: 35.6 — ABNORMAL LOW
MCHC: 34.2
MCHC: 35.7
MCV: 97.3
MCV: 97.6
Platelets: 214
RBC: 3.06 — ABNORMAL LOW
RDW: 12.4
RDW: 12.8
WBC: 14.2 — ABNORMAL HIGH

## 2011-01-02 LAB — CBC
MCHC: 36.1 — ABNORMAL HIGH
Platelets: 209
RBC: 4.02
WBC: 6.8

## 2011-06-18 ENCOUNTER — Ambulatory Visit (INDEPENDENT_AMBULATORY_CARE_PROVIDER_SITE_OTHER): Payer: BC Managed Care – PPO | Admitting: Obstetrics and Gynecology

## 2011-06-18 DIAGNOSIS — Z01419 Encounter for gynecological examination (general) (routine) without abnormal findings: Secondary | ICD-10-CM

## 2011-12-18 ENCOUNTER — Other Ambulatory Visit: Payer: Self-pay | Admitting: Physician Assistant

## 2011-12-18 ENCOUNTER — Other Ambulatory Visit: Payer: Self-pay | Admitting: Obstetrics and Gynecology

## 2011-12-18 NOTE — Telephone Encounter (Signed)
SR pt 

## 2011-12-18 NOTE — Telephone Encounter (Signed)
Pt requesting BCP for control of acne.  Saw dermatologist and suggested this to her.  Will leave chart in SR office for review.  ld

## 2011-12-18 NOTE — Telephone Encounter (Signed)
Wants BCPs

## 2011-12-18 NOTE — Telephone Encounter (Signed)
OK to call in Yasmin to start 1st Sunday after onset of cycle. 3 RF and follow-up in 3 months

## 2011-12-23 MED ORDER — DROSPIRENONE-ETHINYL ESTRADIOL 3-0.03 MG PO TABS: 1.0000 | ORAL_TABLET | Freq: Every day | ORAL | Status: DC

## 2011-12-23 NOTE — Telephone Encounter (Signed)
Pt notified that rx will be sent to pharmacy for Yasmin.  Pt to f/u in 3 months.  Pt agreeable.  ld

## 2012-05-22 ENCOUNTER — Telehealth: Payer: Self-pay | Admitting: Obstetrics and Gynecology

## 2012-05-25 MED ORDER — DROSPIRENONE-ETHINYL ESTRADIOL 3-0.03 MG PO TABS: 1.0000 | ORAL_TABLET | Freq: Every day | ORAL | Status: DC

## 2012-05-25 NOTE — Telephone Encounter (Signed)
Refill sent to pharmacy to last until aex with SR on 06/19/2012 @ 10:00  [place], [place], CMA

## 2014-01-24 ENCOUNTER — Encounter (HOSPITAL_COMMUNITY): Payer: Self-pay | Admitting: *Deleted

## 2014-12-12 ENCOUNTER — Other Ambulatory Visit: Payer: Self-pay | Admitting: Obstetrics and Gynecology

## 2015-01-02 ENCOUNTER — Encounter (HOSPITAL_COMMUNITY)
Admission: RE | Admit: 2015-01-02 | Discharge: 2015-01-02 | Disposition: A | Discharge status: Home / Self Care | Payer: BC Managed Care – PPO | Source: Ambulatory Visit | Attending: Obstetrics and Gynecology | Admitting: Obstetrics and Gynecology

## 2015-01-02 ENCOUNTER — Encounter (HOSPITAL_COMMUNITY): Payer: Self-pay

## 2015-01-02 DIAGNOSIS — G43909 Migraine, unspecified, not intractable, without status migrainosus: Secondary | ICD-10-CM | POA: Diagnosis not present

## 2015-01-02 DIAGNOSIS — Z8742 Personal history of other diseases of the female genital tract: Secondary | ICD-10-CM

## 2015-01-02 DIAGNOSIS — R102 Pelvic and perineal pain: Secondary | ICD-10-CM | POA: Diagnosis present

## 2015-01-02 DIAGNOSIS — N879 Dysplasia of cervix uteri, unspecified: Secondary | ICD-10-CM | POA: Diagnosis not present

## 2015-01-02 DIAGNOSIS — N800 Endometriosis of uterus: Secondary | ICD-10-CM | POA: Diagnosis not present

## 2015-01-02 DIAGNOSIS — N888 Other specified noninflammatory disorders of cervix uteri: Secondary | ICD-10-CM | POA: Diagnosis not present

## 2015-01-02 DIAGNOSIS — N801 Endometriosis of ovary: Secondary | ICD-10-CM | POA: Diagnosis not present

## 2015-01-02 DIAGNOSIS — M1990 Unspecified osteoarthritis, unspecified site: Secondary | ICD-10-CM | POA: Diagnosis not present

## 2015-01-02 DIAGNOSIS — D649 Anemia, unspecified: Secondary | ICD-10-CM | POA: Diagnosis not present

## 2015-01-02 DIAGNOSIS — Z23 Encounter for immunization: Secondary | ICD-10-CM | POA: Diagnosis not present

## 2015-01-02 DIAGNOSIS — Z882 Allergy status to sulfonamides status: Secondary | ICD-10-CM | POA: Diagnosis not present

## 2015-01-02 DIAGNOSIS — N939 Abnormal uterine and vaginal bleeding, unspecified: Secondary | ICD-10-CM | POA: Diagnosis not present

## 2015-01-02 HISTORY — DX: Unspecified osteoarthritis, unspecified site: M19.90

## 2015-01-02 HISTORY — DX: Other specified postprocedural states: Z98.890

## 2015-01-02 HISTORY — DX: Headache: R51

## 2015-01-02 HISTORY — DX: Nausea with vomiting, unspecified: R11.2

## 2015-01-02 HISTORY — DX: Headache, unspecified: R51.9

## 2015-01-02 HISTORY — DX: Anemia, unspecified: D64.9

## 2015-01-02 LAB — TYPE AND SCREEN
ABO/RH(D): O POS
Antibody Screen: NEGATIVE

## 2015-01-02 LAB — CBC
HEMATOCRIT: 38.3 % (ref 36.0–46.0)
HEMOGLOBIN: 13.1 g/dL (ref 12.0–15.0)
MCH: 31.7 pg (ref 26.0–34.0)
MCHC: 34.2 g/dL (ref 30.0–36.0)
MCV: 92.7 fL (ref 78.0–100.0)
Platelets: 273 10*3/uL (ref 150–400)
RBC: 4.13 MIL/uL (ref 3.87–5.11)
RDW: 12 % (ref 11.5–15.5)
WBC: 5.7 10*3/uL (ref 4.0–10.5)

## 2015-01-02 LAB — ABO/RH: ABO/RH(D): O POS

## 2015-01-02 NOTE — Patient Instructions (Signed)
Your procedure is scheduled on:  Tuesday, January 03, 2015  Enter through the Main Entrance of [facility] at: 7:30 A.M.  Pick up the phone at the desk and dial [phone].  Call this number if you have problems the morning of surgery: [phone].  Remember: Do NOT eat food or drink after: MIDNIGHT TONIGHT Take these medicines the morning of surgery with a SIP OF WATER: NONE Do NOT wear jewelry (body piercing), metal hair clips/bobby pins, make-up, or nail polish. Do NOT wear lotions, powders, or perfumes.  You may wear deoderant. Do NOT shave for 48 hours prior to surgery. Do NOT bring valuables to the hospital. Contacts, dentures, or bridgework may not be worn into surgery. Leave suitcase in car.  After surgery it may be brought to your room.  For patients admitted to the hospital, checkout time is 11:00 AM the day of discharge.

## 2015-01-02 NOTE — H&P (Signed)
Cynthia Chase is an 34 y.o. female. Who presents for hysterectomy for pelvic pain unresponsive to extended cycle oral contraceptive pills and history of ovarian cyst. She began having intermittent pelvic pain and positional dyspareunia in 2014.  She was diagnosed with an ovarian cyst which subsequently resoloved spontaneously.  She has used birth control pills to try to avoid recurrence of ovarian cysts, but has had at least one additional documented symptomatic cyst since then.  Pt began to note headaches while on bcps that resolved when they were stopped.  The pt began to experience pelvic pain and dyspareunia unrelated to the use of bcps and has decided on surgical treatment Pertinent Gynecological History: Menses: irregular  Bleeding: normal with menses Contraception: vasectomy DES exposure: unknown Blood transfusions: none Sexually transmitted diseases: no past history Previous GYN Procedures: DNC  Last mammogram: n/a Last pap: ASCUS with neg HR HPV  DATE; 08/2013 OB History: G3, P2  Menstrual History: Menarche age: 21 No LMP recorded.    Past Medical History  Diagnosis Date  . Shingles     3rd and 4th grade  . Varicella     as a child  . Abnormal Pap smear     2006  . Headache     Migraines  . Arthritis     Osteoarthritis  . Anemia     With pregnancy  . PONV (postoperative nausea and vomiting)     Past Surgical History  Procedure Laterality Date  . Knee surgery    . Dilation and curettage of uterus    . Arthroscopic repair acl    . Wisdom tooth extraction    . Foot surgery      Family History  Problem Relation Age of Onset  . Hyperlipidemia Mother   . Cancer Mother   . Cancer Maternal Grandfather     Social History:  reports that she has never smoked. She has never used smokeless tobacco. She reports that she drinks alcohol. She reports that she does not use illicit drugs.  Allergies:  Allergies  Allergen Reactions  . Sulfa Antibiotics Rash     Prescriptions prior to admission  Medication Sig Dispense Refill Last Dose  . ketorolac (TORADOL) 10 MG tablet Take 10 mg by mouth every 6 (six) hours as needed for moderate pain.   Past Month at Unknown time  . norethindrone (AYGESTIN) 5 MG tablet Take 15 mg by mouth daily.   01/02/2015 at 0800  . traMADol (ULTRAM) 50 MG tablet Take 100 mg by mouth every 4 (four) hours as needed for moderate pain or severe pain.   Past Week at Unknown time    Review of Systems  Constitutional: Negative.   HENT:       Headaches associated with BCP use.  Eyes: Negative.   Respiratory: Negative.   Cardiovascular: Negative.   Gastrointestinal: Negative.   Genitourinary: Negative.   Musculoskeletal: Negative.   Skin: Negative.   Neurological: Positive for headaches.  Endo/Heme/Allergies: Negative.   Psychiatric/Behavioral: The patient is nervous/anxious.     Physical Exam  Constitutional: She is oriented to person, place, and time. She appears well-developed and well-nourished.  HENT:  Head: Normocephalic and atraumatic.  Eyes: EOM are normal.  Neck: Normal range of motion. Neck supple.  Cardiovascular: Normal rate and regular rhythm.   Respiratory: Effort normal and breath sounds normal.  GI: Soft. Bowel sounds are normal.  Genitourinary:  Pelvic exam: normal external genitalia, vulva, vagina, cervix, uterus and adnexa.  There is minimal adnexal  tenderness and some posterion cervical tenderness on rectovaginal exam..  Musculoskeletal: Normal range of motion.  Neurological: She is alert and oriented to person, place, and time.  Skin: Skin is warm and dry.  Psychiatric: She has a normal mood and affect.  Temp:  [97.5 F (36.4 C)-98.2 F (36.8 C)] 98.2 F (36.8 C) (10/11 0749) Pulse Rate:  [67-76] 67 (10/11 0749) Resp:  [20] 20 (10/11 0749) BP: (108-129)/(71) 129/71 mmHg (10/11 0749) SpO2:  [100 %] 100 % (10/11 0749) Weight:  [72.292 kg (159 lb 6 oz)] 72.292 kg (159 lb 6 oz) (10/10  0901)  Results for orders placed or performed during the hospital encounter of 01/03/15 (from the past 24 hour(s))  Pregnancy, urine     Status: None   Collection Time: 01/03/15  7:36 AM  Result Value Ref Range   Preg Test, Ur NEGATIVE NEGATIVE     Assessment/Plan: Reviewed her history and the character of her current pain. It is entirely possible that this pain she is feeling is directly related to endometriosis. We had a lengthy discussion regarding endometriosis, the pathophysiology of endometriosis, the diagnosis of endometriosis and possible treatment methods. We reviewed ways to treat this from low tech (for instance progestin only pills) to highly invasive treatment methods like surgery, either operative laparoscopy or for instance hysterectomy and salpingoophorectomy.  We discussed the pros and cons of the methods and she has decided on doing the following: She has decided to proceed with hysterectomy. She understands that she may have recurrence of pain because ovaries are left in place, but also understands the importance of maintaining ovarian function if she can . She understands that endometriosis may not be found at surgery and that no guarantee can be made with respect to relief of pain.She wants to proceed.The risksof surgery were reviewed as anesthesia, infection, bleeding and damage to adjacent organs, and in this case, failure to relieve pain. She wishes to proceed with LAVH and salpingectomy with the possible need for abdominal hysterectomy and removal of ovaries.  Squire Withey P 01/03/2015, 8:49 AM

## 2015-01-03 ENCOUNTER — Ambulatory Visit (HOSPITAL_COMMUNITY): Payer: BC Managed Care – PPO | Admitting: Anesthesiology

## 2015-01-03 ENCOUNTER — Encounter (HOSPITAL_COMMUNITY)
Admission: RE | Disposition: A | Discharge status: Home / Self Care | Payer: Self-pay | Source: Ambulatory Visit | Attending: Obstetrics and Gynecology

## 2015-01-03 ENCOUNTER — Encounter (HOSPITAL_COMMUNITY): Payer: Self-pay

## 2015-01-03 ENCOUNTER — Observation Stay (HOSPITAL_BASED_OUTPATIENT_CLINIC_OR_DEPARTMENT_OTHER)
Admission: RE | Admit: 2015-01-03 | Discharge: 2015-01-04 | Disposition: A | Discharge status: Home / Self Care | Payer: BC Managed Care – PPO | Source: Ambulatory Visit | Attending: Obstetrics and Gynecology | Admitting: Obstetrics and Gynecology

## 2015-01-03 DIAGNOSIS — N800 Endometriosis of uterus: Principal | ICD-10-CM | POA: Insufficient documentation

## 2015-01-03 DIAGNOSIS — N879 Dysplasia of cervix uteri, unspecified: Secondary | ICD-10-CM | POA: Insufficient documentation

## 2015-01-03 DIAGNOSIS — Z23 Encounter for immunization: Secondary | ICD-10-CM | POA: Insufficient documentation

## 2015-01-03 DIAGNOSIS — N939 Abnormal uterine and vaginal bleeding, unspecified: Secondary | ICD-10-CM | POA: Insufficient documentation

## 2015-01-03 DIAGNOSIS — M1990 Unspecified osteoarthritis, unspecified site: Secondary | ICD-10-CM | POA: Insufficient documentation

## 2015-01-03 DIAGNOSIS — Z8742 Personal history of other diseases of the female genital tract: Secondary | ICD-10-CM

## 2015-01-03 DIAGNOSIS — Z882 Allergy status to sulfonamides status: Secondary | ICD-10-CM | POA: Insufficient documentation

## 2015-01-03 DIAGNOSIS — D649 Anemia, unspecified: Secondary | ICD-10-CM | POA: Insufficient documentation

## 2015-01-03 DIAGNOSIS — N888 Other specified noninflammatory disorders of cervix uteri: Secondary | ICD-10-CM | POA: Insufficient documentation

## 2015-01-03 DIAGNOSIS — R102 Pelvic and perineal pain unspecified side: Secondary | ICD-10-CM | POA: Diagnosis present

## 2015-01-03 DIAGNOSIS — G43909 Migraine, unspecified, not intractable, without status migrainosus: Secondary | ICD-10-CM | POA: Insufficient documentation

## 2015-01-03 DIAGNOSIS — N801 Endometriosis of ovary: Secondary | ICD-10-CM | POA: Insufficient documentation

## 2015-01-03 DIAGNOSIS — N80109 Endometriosis of ovary, unspecified side, unspecified depth: Secondary | ICD-10-CM | POA: Diagnosis present

## 2015-01-03 HISTORY — PX: BILATERAL SALPINGECTOMY: SHX5743

## 2015-01-03 HISTORY — PX: LAPAROSCOPIC ASSISTED VAGINAL HYSTERECTOMY: SHX5398

## 2015-01-03 LAB — PREGNANCY, URINE: PREG TEST UR: NEGATIVE

## 2015-01-03 SURGERY — HYSTERECTOMY, VAGINAL, LAPAROSCOPY-ASSISTED
Anesthesia: General

## 2015-01-03 MED ORDER — VASOPRESSIN 20 UNIT/ML IV SOLN
INTRAVENOUS | Status: DC | PRN
  Administered 2015-01-03: 25 mL via INTRAMUSCULAR

## 2015-01-03 MED ORDER — CEFOTETAN DISODIUM 2 G IJ SOLR
2.0000 g | INTRAMUSCULAR | Status: AC
  Administered 2015-01-03: 2 g via INTRAVENOUS
  Filled 2015-01-03: qty 2

## 2015-01-03 MED ORDER — KETOROLAC TROMETHAMINE 30 MG/ML IJ SOLN
INTRAMUSCULAR | Status: AC
  Filled 2015-01-03: qty 1

## 2015-01-03 MED ORDER — BUPIVACAINE HCL (PF) 0.25 % IJ SOLN
INTRAMUSCULAR | Status: DC | PRN
  Administered 2015-01-03: 30 mL

## 2015-01-03 MED ORDER — GLYCOPYRROLATE 0.2 MG/ML IJ SOLN
INTRAMUSCULAR | Status: AC
  Filled 2015-01-03: qty 1

## 2015-01-03 MED ORDER — LACTATED RINGERS IV SOLN
INTRAVENOUS | Status: DC
  Administered 2015-01-03: 14:00:00 via INTRAVENOUS

## 2015-01-03 MED ORDER — ROCURONIUM BROMIDE 100 MG/10ML IV SOLN
INTRAVENOUS | Status: DC | PRN
  Administered 2015-01-03 (×2): 10 mg via INTRAVENOUS
  Administered 2015-01-03: 50 mg via INTRAVENOUS
  Administered 2015-01-03: 5 mg via INTRAVENOUS

## 2015-01-03 MED ORDER — FENTANYL CITRATE (PF) 100 MCG/2ML IJ SOLN
INTRAMUSCULAR | Status: AC
  Administered 2015-01-03: 50 ug via INTRAVENOUS
  Filled 2015-01-03: qty 2

## 2015-01-03 MED ORDER — DIPHENHYDRAMINE HCL 12.5 MG/5ML PO ELIX
12.5000 mg | ORAL_SOLUTION | Freq: Four times a day (QID) | ORAL | Status: DC | PRN
  Administered 2015-01-04 (×2): 12.5 mg via ORAL
  Filled 2015-01-03 (×2): qty 5

## 2015-01-03 MED ORDER — BUPIVACAINE HCL (PF) 0.25 % IJ SOLN
INTRAMUSCULAR | Status: AC
  Filled 2015-01-03: qty 30

## 2015-01-03 MED ORDER — HYDROMORPHONE 0.3 MG/ML IV SOLN
INTRAVENOUS | Status: DC
  Administered 2015-01-03: 12 mL via INTRAVENOUS
  Administered 2015-01-03: 5.7 mg via INTRAVENOUS
  Administered 2015-01-03: 15:00:00 via INTRAVENOUS
  Administered 2015-01-04: 1.75 mg via INTRAVENOUS
  Filled 2015-01-03: qty 25

## 2015-01-03 MED ORDER — INFLUENZA VAC SPLIT QUAD 0.5 ML IM SUSY
0.5000 mL | PREFILLED_SYRINGE | INTRAMUSCULAR | Status: AC
  Administered 2015-01-04: 0.5 mL via INTRAMUSCULAR
  Filled 2015-01-03: qty 0.5

## 2015-01-03 MED ORDER — FENTANYL CITRATE (PF) 100 MCG/2ML IJ SOLN
INTRAMUSCULAR | Status: DC | PRN
  Administered 2015-01-03 (×3): 50 ug via INTRAVENOUS
  Administered 2015-01-03: 100 ug via INTRAVENOUS

## 2015-01-03 MED ORDER — ONDANSETRON HCL 4 MG/2ML IJ SOLN
INTRAMUSCULAR | Status: DC | PRN
  Administered 2015-01-03: 4 mg via INTRAVENOUS

## 2015-01-03 MED ORDER — PROPOFOL 10 MG/ML IV BOLUS
INTRAVENOUS | Status: AC
  Filled 2015-01-03: qty 20

## 2015-01-03 MED ORDER — SODIUM CHLORIDE 0.9 % IJ SOLN: 9.0000 mL | INTRAMUSCULAR | Status: DC | PRN

## 2015-01-03 MED ORDER — ONDANSETRON HCL 4 MG PO TABS: 4.0000 mg | ORAL_TABLET | Freq: Three times a day (TID) | ORAL | Status: DC | PRN

## 2015-01-03 MED ORDER — OXYCODONE-ACETAMINOPHEN 5-325 MG PO TABS
1.0000 | ORAL_TABLET | ORAL | Status: DC | PRN
  Administered 2015-01-04 (×2): 1 via ORAL
  Administered 2015-01-04: 2 via ORAL
  Filled 2015-01-03: qty 1
  Filled 2015-01-03: qty 2
  Filled 2015-01-03: qty 1

## 2015-01-03 MED ORDER — ROCURONIUM BROMIDE 100 MG/10ML IV SOLN
INTRAVENOUS | Status: AC
  Filled 2015-01-03: qty 1

## 2015-01-03 MED ORDER — GLYCOPYRROLATE 0.2 MG/ML IJ SOLN
INTRAMUSCULAR | Status: DC | PRN
  Administered 2015-01-03: 0.6 mg via INTRAVENOUS

## 2015-01-03 MED ORDER — SCOPOLAMINE 1 MG/3DAYS TD PT72
1.0000 | MEDICATED_PATCH | Freq: Once | TRANSDERMAL | Status: DC
  Administered 2015-01-03: 1.5 mg via TRANSDERMAL

## 2015-01-03 MED ORDER — FENTANYL CITRATE (PF) 250 MCG/5ML IJ SOLN
INTRAMUSCULAR | Status: AC
  Filled 2015-01-03: qty 25

## 2015-01-03 MED ORDER — METOCLOPRAMIDE HCL 5 MG/ML IJ SOLN: 10.0000 mg | Freq: Once | INTRAMUSCULAR | Status: DC | PRN

## 2015-01-03 MED ORDER — SCOPOLAMINE 1 MG/3DAYS TD PT72
MEDICATED_PATCH | TRANSDERMAL | Status: AC
  Filled 2015-01-03: qty 1

## 2015-01-03 MED ORDER — MIDAZOLAM HCL 5 MG/5ML IJ SOLN
INTRAMUSCULAR | Status: DC | PRN
  Administered 2015-01-03: 2 mg via INTRAVENOUS

## 2015-01-03 MED ORDER — LIDOCAINE HCL (CARDIAC) 20 MG/ML IV SOLN
INTRAVENOUS | Status: AC
  Filled 2015-01-03: qty 5

## 2015-01-03 MED ORDER — FENTANYL CITRATE (PF) 100 MCG/2ML IJ SOLN
25.0000 ug | INTRAMUSCULAR | Status: DC | PRN
  Administered 2015-01-03 (×4): 50 ug via INTRAVENOUS

## 2015-01-03 MED ORDER — MENTHOL 3 MG MT LOZG: 1.0000 | LOZENGE | OROMUCOSAL | Status: DC | PRN

## 2015-01-03 MED ORDER — DEXAMETHASONE SODIUM PHOSPHATE 4 MG/ML IJ SOLN
INTRAMUSCULAR | Status: AC
Start: 2015-01-03 — End: 2015-01-03
  Filled 2015-01-03: qty 1

## 2015-01-03 MED ORDER — DEXAMETHASONE SODIUM PHOSPHATE 4 MG/ML IJ SOLN
INTRAMUSCULAR | Status: DC | PRN
  Administered 2015-01-03: 4 mg via INTRAVENOUS

## 2015-01-03 MED ORDER — LIDOCAINE HCL (CARDIAC) 20 MG/ML IV SOLN
INTRAVENOUS | Status: DC | PRN
Start: 2015-01-03 — End: 2015-01-03
  Administered 2015-01-03: 50 mg via INTRAVENOUS

## 2015-01-03 MED ORDER — FENTANYL CITRATE (PF) 100 MCG/2ML IJ SOLN
INTRAMUSCULAR | Status: AC
  Filled 2015-01-03: qty 2

## 2015-01-03 MED ORDER — DIPHENHYDRAMINE HCL 50 MG/ML IJ SOLN: 12.5000 mg | Freq: Four times a day (QID) | INTRAMUSCULAR | Status: DC | PRN

## 2015-01-03 MED ORDER — HEPARIN SODIUM (PORCINE) 5000 UNIT/ML IJ SOLN
INTRAMUSCULAR | Status: AC
  Filled 2015-01-03: qty 1

## 2015-01-03 MED ORDER — NEOSTIGMINE METHYLSULFATE 10 MG/10ML IV SOLN
INTRAVENOUS | Status: DC | PRN
  Administered 2015-01-03: 4 mg via INTRAVENOUS

## 2015-01-03 MED ORDER — IBUPROFEN 600 MG PO TABS: 600.0000 mg | ORAL_TABLET | Freq: Four times a day (QID) | ORAL | Status: DC | PRN

## 2015-01-03 MED ORDER — SODIUM CHLORIDE 0.9 % IJ SOLN
INTRAMUSCULAR | Status: AC
  Filled 2015-01-03: qty 50

## 2015-01-03 MED ORDER — ONDANSETRON HCL 4 MG/2ML IJ SOLN: 4.0000 mg | Freq: Four times a day (QID) | INTRAMUSCULAR | Status: DC | PRN

## 2015-01-03 MED ORDER — HYDROCODONE-ACETAMINOPHEN 7.5-325 MG PO TABS: 1.0000 | ORAL_TABLET | Freq: Once | ORAL | Status: DC | PRN

## 2015-01-03 MED ORDER — VASOPRESSIN 20 UNIT/ML IV SOLN
INTRAVENOUS | Status: AC
  Filled 2015-01-03: qty 1

## 2015-01-03 MED ORDER — MIDAZOLAM HCL 2 MG/2ML IJ SOLN
INTRAMUSCULAR | Status: AC
  Filled 2015-01-03: qty 4

## 2015-01-03 MED ORDER — DOCUSATE SODIUM 100 MG PO CAPS
100.0000 mg | ORAL_CAPSULE | Freq: Two times a day (BID) | ORAL | Status: DC
  Administered 2015-01-03 – 2015-01-04 (×2): 100 mg via ORAL
  Filled 2015-01-03 (×2): qty 1

## 2015-01-03 MED ORDER — LACTATED RINGERS IV SOLN
INTRAVENOUS | Status: DC
  Administered 2015-01-03: 125 mL/h via INTRAVENOUS
  Administered 2015-01-03 (×2): via INTRAVENOUS

## 2015-01-03 MED ORDER — KETOROLAC TROMETHAMINE 30 MG/ML IJ SOLN
30.0000 mg | Freq: Four times a day (QID) | INTRAMUSCULAR | Status: AC
  Administered 2015-01-03 – 2015-01-04 (×3): 30 mg via INTRAVENOUS
  Filled 2015-01-03 (×3): qty 1

## 2015-01-03 MED ORDER — MEPERIDINE HCL 25 MG/ML IJ SOLN: 6.2500 mg | INTRAMUSCULAR | Status: DC | PRN

## 2015-01-03 MED ORDER — HYDROMORPHONE HCL 1 MG/ML IJ SOLN
INTRAMUSCULAR | Status: AC
  Filled 2015-01-03: qty 1

## 2015-01-03 MED ORDER — NALOXONE HCL 0.4 MG/ML IJ SOLN: 0.4000 mg | INTRAMUSCULAR | Status: DC | PRN

## 2015-01-03 MED ORDER — PROPOFOL 10 MG/ML IV BOLUS
INTRAVENOUS | Status: DC | PRN
  Administered 2015-01-03: 170 mg via INTRAVENOUS

## 2015-01-03 MED ORDER — HYDROMORPHONE HCL 1 MG/ML IJ SOLN
INTRAMUSCULAR | Status: DC | PRN
  Administered 2015-01-03 (×2): 0.5 mg via INTRAVENOUS

## 2015-01-03 MED ORDER — KETOROLAC TROMETHAMINE 30 MG/ML IJ SOLN
INTRAMUSCULAR | Status: DC | PRN
  Administered 2015-01-03: 30 mg via INTRAVENOUS

## 2015-01-03 MED ORDER — ONDANSETRON HCL 4 MG/2ML IJ SOLN
INTRAMUSCULAR | Status: AC
  Filled 2015-01-03: qty 2

## 2015-01-03 SURGICAL SUPPLY — 56 items
ATTRACTOMAT 16X20 MAGNETIC DRP (DRAPES) ×4 IMPLANT
CABLE HIGH FREQUENCY MONO STRZ (ELECTRODE) IMPLANT
CATH ROBINSON RED A/P 16FR (CATHETERS) IMPLANT
CLOSURE WOUND 1/4 X3 (GAUZE/BANDAGES/DRESSINGS)
CLOTH BEACON ORANGE TIMEOUT ST (SAFETY) ×4 IMPLANT
CONT PATH 16OZ SNAP LID 3702 (MISCELLANEOUS) ×4 IMPLANT
COVER BACK TABLE 60X90IN (DRAPES) ×4 IMPLANT
DECANTER SPIKE VIAL GLASS SM (MISCELLANEOUS) ×8 IMPLANT
DRAPE SHEET LG 3/4 BI-LAMINATE (DRAPES) ×8 IMPLANT
DRSG COVADERM PLUS 2X2 (GAUZE/BANDAGES/DRESSINGS) ×8 IMPLANT
DRSG OPSITE POSTOP 3X4 (GAUZE/BANDAGES/DRESSINGS) ×4 IMPLANT
DRSG TELFA 3X8 NADH (GAUZE/BANDAGES/DRESSINGS) ×4 IMPLANT
DURAPREP 26ML APPLICATOR (WOUND CARE) ×4 IMPLANT
ELECT REM PT RETURN 9FT ADLT (ELECTROSURGICAL) ×4
ELECTRODE REM PT RTRN 9FT ADLT (ELECTROSURGICAL) ×2 IMPLANT
EVACUATOR SMOKE 8.L (FILTER) ×4 IMPLANT
FORCEPS CUTTING 33CM 5MM (CUTTING FORCEPS) IMPLANT
FORCEPS CUTTING 45CM 5MM (CUTTING FORCEPS) IMPLANT
GAUZE PACKING 2X5 YD STRL (GAUZE/BANDAGES/DRESSINGS) IMPLANT
GLOVE BIOGEL PI IND STRL 6.5 (GLOVE) ×2 IMPLANT
GLOVE BIOGEL PI INDICATOR 6.5 (GLOVE) ×2
GLOVE ECLIPSE 7.0 STRL STRAW (GLOVE) ×4 IMPLANT
GLOVE INDICATOR 7.0 STRL GRN (GLOVE) ×8 IMPLANT
GLOVE SURG SS PI 6.5 STRL IVOR (GLOVE) ×12 IMPLANT
LEGGING LITHOTOMY PAIR STRL (DRAPES) ×4 IMPLANT
LIQUID BAND (GAUZE/BANDAGES/DRESSINGS) ×4 IMPLANT
NEEDLE MAYO .5 CIRCLE (NEEDLE) ×4 IMPLANT
NS IRRIG 1000ML POUR BTL (IV SOLUTION) ×4 IMPLANT
PACK LAVH (CUSTOM PROCEDURE TRAY) ×4 IMPLANT
PACK ROBOTIC GOWN (GOWN DISPOSABLE) ×4 IMPLANT
PAD MAGNETIC INST (MISCELLANEOUS) ×4 IMPLANT
PAD POSITIONING PINK XL (MISCELLANEOUS) ×4 IMPLANT
SET IRRIG TUBING LAPAROSCOPIC (IRRIGATION / IRRIGATOR) IMPLANT
SHEARS HARMONIC ACE PLUS 36CM (ENDOMECHANICALS) IMPLANT
SOLUTION ELECTROLUBE (MISCELLANEOUS) IMPLANT
STRIP CLOSURE SKIN 1/4X3 (GAUZE/BANDAGES/DRESSINGS) IMPLANT
SUT CHROMIC 2 0 TIES 18 (SUTURE) IMPLANT
SUT MNCRL AB 4-0 PS2 18 (SUTURE) ×4 IMPLANT
SUT VIC AB 0 CT1 18XCR BRD8 (SUTURE) ×4 IMPLANT
SUT VIC AB 0 CT1 27 (SUTURE)
SUT VIC AB 0 CT1 27XCR 8 STRN (SUTURE) IMPLANT
SUT VIC AB 0 CT1 8-18 (SUTURE) ×4
SUT VIC AB 3-0 PS2 18 (SUTURE) ×2
SUT VIC AB 3-0 PS2 18XBRD (SUTURE) ×2 IMPLANT
SUT VICRYL 0 ENDOLOOP (SUTURE) IMPLANT
SUT VICRYL 0 TIES 12 18 (SUTURE) ×4 IMPLANT
SUT VICRYL 0 UR6 27IN ABS (SUTURE) ×8 IMPLANT
SYR 20CC LL (SYRINGE) ×4 IMPLANT
SYR 50ML LL SCALE MARK (SYRINGE) ×4 IMPLANT
SYR TB 1ML LUER SLIP (SYRINGE) ×3 IMPLANT
TOWEL OR 17X24 6PK STRL BLUE (TOWEL DISPOSABLE) ×8 IMPLANT
TRAY FOLEY CATH SILVER 14FR (SET/KITS/TRAYS/PACK) ×4 IMPLANT
TROCAR BALL TOP DISP 5MM (ENDOMECHANICALS) ×4 IMPLANT
TROCAR XCEL DIL TIP R 11M (ENDOMECHANICALS) ×4 IMPLANT
WARMER LAPAROSCOPE (MISCELLANEOUS) ×4 IMPLANT
WATER STERILE IRR 1000ML POUR (IV SOLUTION) ×4 IMPLANT

## 2015-01-03 NOTE — Op Note (Signed)
OPERATIVE REPORT   INDICATIONS:pelvic pain  PRE-OP DIAGNOSIS:Pelvic Pain, H/o Ovarian Cysts   POST-OP DIAGNOSIS;Pelvic Pain, H/o Ovarian Cysts   PROCEDURE:Procedure(s) (LRB): LAPAROSCOPIC ASSISTED VAGINAL HYSTERECTOMY (N/A) BILATERAL SALPINGECTOMY (Bilateral) right ovarian biopsy  SURGEON:Miqueas Whilden P  ASSIST:Elmira Lowell Guitar PA-C  SPECIMENS:@ORSPECIMEN @   DISPOSITION OF SPECIMEN:Delivered to Histology  EBL:50cc COMPLICATIONS: none   PATIENT TO:  PACU - hemodynamically stable.    ASA Class: 1  Procedure Details  The patient was seen in the Holding Room. The risks, benefits, complications, treatment options, and expected outcomes were discussed with the patient. The possibilities of reaction to medication, pulmonary aspiration, perforation of viscus, bleeding, recurrent infection, the need for additional procedures, failure to diagnose a condition, and creating a complication requiring transfusion or operation were discussed with the patient. The patient concurred with the proposed plan, giving informed consent. The patient was taken to the Operating Room, identified as Cynthia Chase and the procedure verified as laparoscopically assisted vaginal hysterectomy and bilateral salpingectomy with possibility of removal of one or both ovaries. A Time Out was held and the above information confirmed.  After induction of general anesthesia, the patient was placed in modified dorsal lithotomy position where the abdomen, perineum and vagina were prepped, draped, and a Foley catheter was placed under sterile conditions.  The cervix was visualized and an intrauterine manipulator was placed. A 1.5 cm umbilical incision was then performed. The incision was taken down with a combination of blunt and sharp dissection into the peritoneum and the Hassan cannula placed into the peritoneal cavity under direct visualization.  It was anchored in place by 2 sutures of 0 Vicryl in 2 the peritoneum and  fascia.  A pneumoperitoneum was established. The  Findings below were noted.  Findings: The anterior cul-de-sac and round ligaments no lesions or evidence of endometriosis. The uterus normal sized and slightly posterior with filmy adhesions near the right uterosacral ligament The adnexa . The right ovary contained a central powder burn lesion and was adherent to the right pelvic sidewall with filmy adhesions. The right fallopian tube was without lesions. The left tube and ovary were within normal limits. Cul-de-sac there were a few filmy adhesions near the right of uterosacral ligament   The aforementioned filmy adhesions were lysed laparoscopically. The decision was then made to proceed with vaginal hysterectomy. . A weighted speculum was placed in the posterior vagina and Lahey tenaculae were placed on the anterior and posterior surfaces of the cervix. The cervicovaginal mucosa was injected with a dilute solution of Pitressin. The cervix was circumscribed. The anterior vaginal mucosa wasbluntly dissected off the anterior cervix and the anterior peritoneum entered. The bladder blade was placed and the bladder elevated. The posterior peritoneum was entered sharply and tagged. Uterosacral ligaments on the right and left side were clamped cut and suture ligated, and the sutures held. The paracervical tissues were then clamped cut and suture ligated. The uterine arteries were clamped cut and suture ligated. The parametral tissues were clamped, cut, and suture ligated.The uterus was inverted and the upper pedicles clamped cut tied with free tie and suture-ligated. The uterus and cervix were removed from the operative field. The left fallopian tube was then visualized and grasped with a Babcock clamp. A Kelly clamp was used to clamp the mesial salpinx. The fallopian tube was excised and the mesial salpinx suture-ligated with 0 Vicryl. A similar procedure was carried out on the right side. In addition, the right  ovary containing a powder burn lesion underwent excisional bi  The McCall culdoplasty sutures were then placed incorporating the uterosacral ligaments on either side, and the intervening peritoneum.  These were held. The vaginal angles were created with the sutures from the  uterosacral ligaments that had been held and placed through the anterior and posterior surfaces of the vagina then tied down. The remainder of the vaginal cuff was closed with figure-of-eight sutures of.  All sutures used were  .  The McCall culdoplasty sutures were then tied down. opsy of that lesion with cautery and hemostasis was noted to be adequate. The surgeons regowned and gloved then moved to the abdomen as the site of operation. A pneumoperitoneum was re-created and the laparoscope used to visualize the postoperative pelvis. Hemostasis was noted to be adequate.Following the procedure the suprapubic sheaths was removed under direct visualization and the  umbilical sheath was removed after intra-abdominal carbon dioxide was expressed. The incision was closed  the anchoring sutures from the [employer] cannula, which were used to create 2 figure-of-eight sutures to close the peritoneum and fascia. The skin was closed with with subcuticular sutures of3-0 Monocryl..   Instrument, sponge, and needle counts were correct prior to abdominal closure and at the conclusion of the case. The patient was awakened from general anesthesia and taken to the recovery room in satisfactory condition having tolerated procedure well sponge and instrument counts correct.  Hal Morales, MD 12:39 PM

## 2015-01-03 NOTE — Progress Notes (Signed)
Day of Surgery Procedure(s) (LRB): LAPAROSCOPIC ASSISTED VAGINAL HYSTERECTOMY (N/A) BILATERAL SALPINGECTOMY (Bilateral)  Subjective: Patient reports no nausea, vomiting, incisional pain controlled by PCA and tolerating PO soft dinner    Objective: I have reviewed patient's vital signs, intake and output and medications.  General: alert, cooperative and no distress Resp: clear to auscultation bilaterally Cardio: regular rate and rhythm, S1, S2 normal, no murmur, click, rub or gallop GI: soft, non-tender; bowel sounds normal; no masses,  no organomegaly Extremities: extremities normal, atraumatic, no cyanosis or edema Vaginal Bleeding: minimal  Assessment: s/p Procedure(s): LAPAROSCOPIC ASSISTED VAGINAL HYSTERECTOMY (N/A) BILATERAL SALPINGECTOMY (Bilateral): stable  Plan: IN AM: Advance diet  Encourage ambulation  Advance to PO medication  Discontinue IV fluids  Discharge home     Nija Koopman P 01/03/2015, 8:42 PM

## 2015-01-03 NOTE — Transfer of Care (Signed)
Immediate Anesthesia Transfer of Care Note  Patient: Cynthia Chase  Procedure(s) Performed: Procedure(s): LAPAROSCOPIC ASSISTED VAGINAL HYSTERECTOMY (N/A) BILATERAL SALPINGECTOMY (Bilateral)  Patient Location: PACU  Anesthesia Type:General  Level of Consciousness: awake, alert  and oriented  Airway & Oxygen Therapy: Patient Spontanous Breathing and Patient connected to nasal cannula oxygen  Post-op Assessment: Report given to RN  Post vital signs: Reviewed  Last Vitals:  Filed Vitals:   01/03/15 0749  BP: 129/71  Pulse: 67  Temp: 36.8 C  Resp: 20    Complications: No apparent anesthesia complications

## 2015-01-03 NOTE — Anesthesia Preprocedure Evaluation (Addendum)
Anesthesia Evaluation  Patient identified by MRN, date of birth, ID band Patient awake    Reviewed: Allergy & Precautions, NPO status , Patient's Chart, lab work & pertinent test results  History of Anesthesia Complications (+) PONV and history of anesthetic complications  Airway Mallampati: I  TM Distance: >3 FB Neck ROM: Full    Dental no notable dental hx. (+) Teeth Intact   Pulmonary neg pulmonary ROS,    Pulmonary exam normal breath sounds clear to auscultation       Cardiovascular negative cardio ROS Normal cardiovascular exam Rhythm:Regular Rate:Normal     Neuro/Psych  Headaches, negative psych ROS   GI/Hepatic negative GI ROS, Neg liver ROS,   Endo/Other  negative endocrine ROS  Renal/GU negative Renal ROS  negative genitourinary   Musculoskeletal  (+) Arthritis , Osteoarthritis,    Abdominal   Peds  Hematology  (+) anemia ,   Anesthesia Other Findings   Reproductive/Obstetrics Pelvic pain Hx/o ovarian cysts                            Anesthesia Physical Anesthesia Plan  ASA: II  Anesthesia Plan: General   Post-op Pain Management:    Induction: Intravenous  Airway Management Planned: Oral ETT  Additional Equipment:   Intra-op Plan:   Post-operative Plan: Extubation in OR  Informed Consent: I have reviewed the patients History and Physical, chart, labs and discussed the procedure including the risks, benefits and alternatives for the proposed anesthesia with the patient or authorized representative who has indicated his/her understanding and acceptance.   Dental advisory given  Plan Discussed with: Anesthesiologist, CRNA and Surgeon  Anesthesia Plan Comments:         Anesthesia Quick Evaluation

## 2015-01-03 NOTE — Anesthesia Procedure Notes (Signed)
Procedure Name: Intubation Date/Time: 01/03/2015 9:22 AM Performed by: Jhonnie Garner Pre-anesthesia Checklist: Patient identified, Emergency Drugs available, Suction available and Patient being monitored Patient Re-evaluated:Patient Re-evaluated prior to inductionOxygen Delivery Method: Circle system utilized Preoxygenation: Pre-oxygenation with 100% oxygen Intubation Type: IV induction Ventilation: Mask ventilation without difficulty Laryngoscope Size: Miller and 2 Grade View: Grade I Tube type: Oral Tube size: 7.0 mm Number of attempts: 1 Airway Equipment and Method: Stylet Placement Confirmation: ETT inserted through vocal cords under direct vision,  positive ETCO2 and breath sounds checked- equal and bilateral Secured at: 21 cm Tube secured with: Tape Dental Injury: Teeth and Oropharynx as per pre-operative assessment

## 2015-01-03 NOTE — Anesthesia Postprocedure Evaluation (Signed)
  Anesthesia Post-op Note  Patient: Cynthia Chase  Procedure(s) Performed: Procedure(s) (LRB): LAPAROSCOPIC ASSISTED VAGINAL HYSTERECTOMY (N/A) BILATERAL SALPINGECTOMY (Bilateral)  Patient Location: PACU  Anesthesia Type: General  Level of Consciousness: awake and alert   Airway and Oxygen Therapy: Patient Spontanous Breathing  Post-op Pain: mild  Post-op Assessment: Post-op Vital signs reviewed, Patient's Cardiovascular Status Stable, Respiratory Function Stable, Patent Airway and No signs of Nausea or vomiting  Last Vitals:  Filed Vitals:   01/03/15 1345  BP: 108/52  Pulse: 81  Temp:   Resp: 12    Post-op Vital Signs: stable   Complications: No apparent anesthesia complications

## 2015-01-03 NOTE — Addendum Note (Signed)
Addendum  created 01/03/15 1532 by Renford Dills, CRNA   Modules edited: Notes Section   Notes Section:  File: [id number]

## 2015-01-03 NOTE — Anesthesia Postprocedure Evaluation (Signed)
  Anesthesia Post-op Note  Patient: Cynthia Chase  Procedure(s) Performed: Procedure(s): LAPAROSCOPIC ASSISTED VAGINAL HYSTERECTOMY (N/A) BILATERAL SALPINGECTOMY (Bilateral)  Patient Location: [facility]  Anesthesia Type:General  Level of Consciousness: awake  Airway and Oxygen Therapy: Patient Spontanous Breathing  Post-op Pain: mild  Post-op Assessment: Patient's Cardiovascular Status Stable and Respiratory Function Stable              Post-op Vital Signs: stable  Last Vitals:  Filed Vitals:   01/03/15 1522  BP: 107/66  Pulse: 66  Temp: 36.5 C  Resp: 16    Complications: No apparent anesthesia complications

## 2015-01-03 NOTE — Brief Op Note (Signed)
01/03/2015  12:22 PM  PATIENT:  Cynthia Chase  34 y.o. female  PRE-OPERATIVE DIAGNOSIS:  Pelvic Pain, H/o Ovarian Cysts  POST-OPERATIVE DIAGNOSIS:  Pelvic Pain, H/o Ovarian Cysts  PROCEDURE:  Procedure(s): LAPAROSCOPIC ASSISTED VAGINAL HYSTERECTOMY (N/A) BILATERAL SALPINGECTOMY (Bilateral), right ovarian biopsy  SURGEON:  Surgeon(s) and Role:    * Hal Morales, MD - Primary  PHYSICIAN ASSISTANT:   ASSISTANTS: Henreitta Leber PA-C  ANESTHESIA:   general  EBL:  Total I/O In: 1700 [I.V.:1700] Out: 450 [Urine:400; Blood:50]  BLOOD ADMINISTERED:none  DRAINS: Urinary Catheter (Foley)   LOCAL MEDICATIONS USED:  MARCAINE     Amount: 30 ml  SPECIMEN:  Source of Specimen:  uterus, bilateral tubes ( stitch in left tube. right tube in two pieces), ovarian biopsy  DISPOSITION OF SPECIMEN:  PATHOLOGY  COUNTS:  YES  DICTATION: .Note written in EPIC  PLAN OF CARE: Admit for overnight observation  PATIENT DISPOSITION:  PACU - hemodynamically stable.   Delay start of Pharmacological VTE agent (>24hrs) due to surgical blood loss or risk of bleeding: yes  SCD hose used   during the case

## 2015-01-04 ENCOUNTER — Encounter (HOSPITAL_COMMUNITY): Payer: Self-pay | Admitting: Obstetrics and Gynecology

## 2015-01-04 DIAGNOSIS — N800 Endometriosis of uterus: Secondary | ICD-10-CM | POA: Diagnosis not present

## 2015-01-04 LAB — CBC
HCT: 33 % — ABNORMAL LOW (ref 36.0–46.0)
Hemoglobin: 11.3 g/dL — ABNORMAL LOW (ref 12.0–15.0)
MCH: 32 pg (ref 26.0–34.0)
MCHC: 34.2 g/dL (ref 30.0–36.0)
MCV: 93.5 fL (ref 78.0–100.0)
PLATELETS: 254 10*3/uL (ref 150–400)
RBC: 3.53 MIL/uL — AB (ref 3.87–5.11)
RDW: 12 % (ref 11.5–15.5)
WBC: 12.9 10*3/uL — ABNORMAL HIGH (ref 4.0–10.5)

## 2015-01-04 MED ORDER — ONDANSETRON HCL 4 MG PO TABS: 4.0000 mg | ORAL_TABLET | Freq: Three times a day (TID) | ORAL | Status: DC | PRN

## 2015-01-04 MED ORDER — OXYCODONE-ACETAMINOPHEN 5-325 MG PO TABS: 1.0000 | ORAL_TABLET | ORAL | Status: DC | PRN

## 2015-01-04 MED ORDER — DIPHENHYDRAMINE HCL 12.5 MG/5ML PO ELIX: 12.5000 mg | ORAL_SOLUTION | Freq: Four times a day (QID) | ORAL | Status: DC | PRN

## 2015-01-04 MED ORDER — DOCUSATE SODIUM 100 MG PO CAPS: 100.0000 mg | ORAL_CAPSULE | Freq: Two times a day (BID) | ORAL | Status: DC | PRN

## 2015-01-04 MED ORDER — IBUPROFEN 600 MG PO TABS: ORAL_TABLET | ORAL | Status: DC

## 2015-01-04 NOTE — Progress Notes (Signed)
Pt out in wheelchair  Teaching complete   

## 2015-01-04 NOTE — Discharge Instructions (Signed)
Call [facility] @ [phone] if:  You have a temperature greater than or equal to 100.4 degrees Farenheit orally You have pain that is not made better by the pain medication given and taken as directed You have excessive bleeding or problems urinating  Take Colace (Docusate Sodium/Stool Softener) 100 mg 2-3 times daily while taking narcotic pain medicine to avoid constipation or until bowel movements are regular. Take Ibuprofen 600 mg with food every 6 hours for 5 days, then as needed for pain  You may drive after 2 weeks You may walk up steps  You may shower  You may resume a regular diet  Keep incisions clean and dry Do not lift over 15 pounds for 6 weeks Avoid anything in vagina for 6 weeks (or until after your post-operative visit)  Keep follow up appointment with Dr. Pennie RushingHaygood on February 08, 2015 at 8:45 a.m. Laparoscopically Assisted Vaginal Hysterectomy, Care After Refer to this sheet in the next few weeks. These instructions provide you with information on caring for yourself after your procedure. Your health care provider may also give you more specific instructions. Your treatment has been planned according to current medical practices, but problems sometimes occur. Call your health care provider if you have any problems or questions after your procedure. WHAT TO EXPECT AFTER THE PROCEDURE After your procedure, it is typical to have the following:  Abdominal pain. You will be given pain medicine to control it.  Sore throat from the breathing tube that was inserted during surgery. HOME CARE INSTRUCTIONS  Only take over-the-counter or prescription medicines for pain, discomfort, or fever as directed by your health care provider.  Do not take aspirin. It can cause bleeding.  Do not drive when taking pain medicine.  Follow your health care provider's advice regarding diet, exercise, lifting, driving, and general activities.  Resume your usual diet as directed  and allowed.  Get plenty of rest and sleep.  Do not douche, use tampons, or have sexual intercourse for at least 6 weeks, or until your health care provider gives you permission.  Change your bandages (dressings) as directed by your health care provider.  Monitor your temperature and notify your health care provider of a fever.  Take showers instead of baths for 2-3 weeks.  Do not drink alcohol until your health care provider gives you permission.  If you develop constipation, you may take a mild laxative with your health care provider's permission. Bran foods may help with constipation problems. Drinking enough fluids to keep your urine clear or pale yellow may help as well.  Try to have someone home with you for 1-2 weeks to help around the house.  Keep all of your follow-up appointments as directed by your health care provider. SEEK MEDICAL CARE IF:   You have swelling, redness, or increasing pain around your incision sites.  You have pus coming from your incision.  You notice a bad smell coming from your incision.  Your incision breaks open.  You feel dizzy or lightheaded.  You have pain or bleeding when you urinate.  You have persistent diarrhea.  You have persistent nausea and vomiting.  You have abnormal vaginal discharge.  You have a rash.  You have any type of abnormal reaction or develop an allergy to your medicine.  You have poor pain control with your prescribed medicine. SEEK IMMEDIATE MEDICAL CARE IF:   You have a fever.  You have severe abdominal pain.  You have chest pain.  You have  shortness of breath.  You faint.  You have pain, swelling, or redness in your leg.  You have heavy vaginal bleeding with blood clots. MAKE SURE YOU:  Understand these instructions.  Will watch your condition.  Will get help right away if you are not doing well or get worse.   This information is not intended to replace advice given to you by your health  care provider. Make sure you discuss any questions you have with your health care provider.   Document Released: 02/28/2011 Document Revised: 03/16/2013 Document Reviewed: 09/24/2012 Elsevier Interactive Patient Education [employer]

## 2015-01-04 NOTE — Progress Notes (Signed)
Cynthia MuscatKatherine R Chase is a34 y.o.  [id number]  Post Op Date # 1:  LAVH/BS  Subjective: Patient is Doing well postoperatively. Patient has Pain is controlled with current analgesics. Medications being used: prescription NSAID's including Ketorolac and narcotic analgesics including Percocet. Ambulating without difficulty, tolerating regular breakfast, voided without difficulty.  Objective: Vital signs in last 24 hours: Temp:  [97.6 F (36.4 C)-98.9 F (37.2 C)] 98.8 F (37.1 C) (10/12 0640) Pulse Rate:  [66-102] 84 (10/12 0640) Resp:  [11-17] 16 (10/12 0640) BP: (99-115)/(52-77) 101/52 mmHg (10/12 0640) SpO2:  [95 %-100 %] 96 % (10/12 0640) Weight:  [159 lb (72.122 kg)] 159 lb (72.122 kg) (10/11 1500)  Intake/Output from previous day: 10/11 0701 - 10/12 0700 In: 4687.5 [P.O.:960; I.V.:3727.5] Out: 3600 [Urine:3550] Intake/Output this shift: Total I/O In: -  Out: 400 [Urine:400]  Recent Labs Lab 01/02/15 0930 01/04/15 0540  WBC 5.7 12.9*  HGB 13.1 11.3*  HCT 38.3 33.0*  PLT 273 254    No results for input(s): NA, K, CL, CO2, BUN, CREATININE, CALCIUM, PROT, BILITOT, ALKPHOS, ALT, AST, GLUCOSE in the last 168 hours.  Invalid input(s): LABALBU  EXAM: General: alert, cooperative and no distress Resp: clear to auscultation bilaterally Cardio: regular rate and rhythm, S1, S2 normal, no murmur, click, rub or gallop GI: Bowel sounds present, soft, umbilical incision dressing intact/clea/dry; left lower abdominal incision with good wound edge approximation and no evidence of infection. Extremities: Homans sign is negative, no sign of DVT and no calf tenderness. Vaginal Bleeding: faint dry pink stain on perineal pad.   Assessment: s/p Procedure(s): LAPAROSCOPIC ASSISTED VAGINAL HYSTERECTOMY BILATERAL SALPINGECTOMY: stable, progressing well and tolerating diet  Plan: Discharge home    POWELL,ELMIRA, PA-C 01/04/2015 7:55 AM

## 2015-01-04 NOTE — Discharge Summary (Signed)
Physician Discharge Summary  Patient ID: Cynthia Chase MRN: [id number] DOB/AGE: Oct 10, 1980 34 y.o.  Admit date: 01/03/2015 Discharge date: 01/05/2015   Discharge Diagnoses: Chronic Pelvic Pain and Endometriosis Active Problems:   Pelvic pain in female   Personal history of ovarian cyst   Endometriosis of ovary   Operation: Laparoscopically Assisted Vaginal Hysterectomy with Bilateral Salpingectomy   Discharged Condition: Good  Hospital Course: On the date of admission,  the patient underwent the aforementioned procedures and tolerated them well.  Post operative course was unremarkable with the patient resuming bowel and bladder function by post operative day #1 and therefore was deemed ready for discharge home.   Discharge hemoglobin was 11.3.  Disposition: 01-Home or Self Care  Condiditon:  Recovering well  Discharge Medications:    Medication List    STOP taking these medications        ketorolac 10 MG tablet  Commonly known as:  TORADOL     norethindrone 5 MG tablet  Commonly known as:  AYGESTIN     traMADol 50 MG tablet  Commonly known as:  ULTRAM      TAKE these medications        diphenhydrAMINE 12.5 MG/5ML elixir  Commonly known as:  BENADRYL  Take 5 mLs (12.5 mg total) by mouth every 6 (six) hours as needed for itching.     docusate sodium 100 MG capsule  Commonly known as:  COLACE  Take 1 capsule (100 mg total) by mouth 2 (two) times daily as needed for mild constipation (take at least daily as long as taking Percocet).     ibuprofen 600 MG tablet  Commonly known as:  ADVIL,MOTRIN  1  po pc every 6 hours for 5 days then prn-pain     ondansetron 4 MG tablet  Commonly known as:  ZOFRAN  Take 1 tablet (4 mg total) by mouth every 8 (eight) hours as needed for nausea or vomiting.     oxyCODONE-acetaminophen 5-325 MG tablet  Commonly known as:  PERCOCET/ROXICET  Take 1-2 tablets by mouth every 4 (four) hours as needed for severe pain (moderate to  severe pain (when tolerating fluids)).           Follow-up: Dr. Erie NoeVanessa Chase. [name] on February 08, 2015 at 8:45 a.m.   SignedHal Morales: Cynthia Youkhana P, PA-C 01/05/2015, 5:30 PM

## 2015-07-11 ENCOUNTER — Encounter: Payer: Self-pay | Admitting: Gastroenterology

## 2015-07-27 ENCOUNTER — Other Ambulatory Visit: Payer: Self-pay

## 2015-07-27 ENCOUNTER — Ambulatory Visit (INDEPENDENT_AMBULATORY_CARE_PROVIDER_SITE_OTHER): Payer: BC Managed Care – PPO | Admitting: Gastroenterology

## 2015-07-27 ENCOUNTER — Encounter: Payer: Self-pay | Admitting: Gastroenterology

## 2015-07-27 ENCOUNTER — Telehealth: Payer: Self-pay

## 2015-07-27 VITALS — BP 118/78 | HR 70 | Temp 97.7°F | Ht 68.0 in | Wt 146.1 lb

## 2015-07-27 DIAGNOSIS — R197 Diarrhea, unspecified: Secondary | ICD-10-CM

## 2015-07-27 DIAGNOSIS — K529 Noninfective gastroenteritis and colitis, unspecified: Secondary | ICD-10-CM | POA: Insufficient documentation

## 2015-07-27 DIAGNOSIS — R1031 Right lower quadrant pain: Secondary | ICD-10-CM

## 2015-07-27 DIAGNOSIS — K625 Hemorrhage of anus and rectum: Secondary | ICD-10-CM | POA: Diagnosis not present

## 2015-07-27 DIAGNOSIS — K602 Anal fissure, unspecified: Secondary | ICD-10-CM | POA: Insufficient documentation

## 2015-07-27 DIAGNOSIS — G8929 Other chronic pain: Secondary | ICD-10-CM | POA: Diagnosis not present

## 2015-07-27 NOTE — Patient Instructions (Signed)
FULL LIQUID DIET MAY 11. STARTING WITH BREAKFAST. SEE INFO BELOW.  COLONOSCOPY WITH POSSIBLE HEMORRHOID BANDING ON FRI MAY 12.  CT SCAN OF ABDOMEN AND PELVIS WITHIN 3-5 DAYS.  I WILL SCHEDULE YOUR FOLLOW UP AFTER THE ENDOSCOPY.   Full Liquid Diet A high-calorie, high-protein supplement should be used to meet your nutritional requirements when the full liquid diet is continued for more than 2 or 3 days. If this diet is to be used for an extended period of time (more than 7 days), a multivitamin should be considered.  Breads and Starches  Allowed: None are allowed except crackers pureed (made into a thick, smooth soup) in soup.   Avoid: Any others.    Potatoes/Pasta/Rice  Allowed: ANY ITEM AS A SOUP OR SMALL PLATE OF MASHED POTATOES OR SCRAMBLED EGGS.       Vegetables  Allowed: Strained tomato or vegetable juice. Vegetables pureed in soup.   Avoid: Any others.    Fruit  Allowed: Any strained fruit juices and fruit drinks. Include 1 serving of citrus or vitamin C-enriched fruit juice daily.   Avoid: Any others.  Meat and Meat Substitutes  Allowed: Egg  Avoid: Any meat, fish, or fowl. All cheese.  Milk  Allowed: SOY Milk beverages, including milk shakes and instant breakfast mixes. Smooth yogurt.   Avoid: Any others. Avoid dairy products if not tolerated.    Soups and Combination Foods  Allowed: Broth, strained cream soups. Strained, broth-based soups.   Avoid: Any others.    Desserts and Sweets  Allowed: flavored gelatin, tapioca, ice cream, sherbet, smooth pudding, junket, fruit ices, frozen ice pops, pudding pops, frozen fudge pops, chocolate syrup. Sugar, honey, jelly, syrup.   Avoid: Any others.  Fats and Oils  Allowed: Margarine, butter, cream, sour cream, oils.   Avoid: Any others.  Beverages  Allowed: All.   Avoid: None.  Condiments  Allowed: Iodized salt, pepper, spices, flavorings. Cocoa powder.   Avoid: Any others.    SAMPLE MEAL  PLAN Breakfast   cup orange juice.   1 OR 2 EGGS  1 cup milk.   1 cup beverage (coffee or tea).   Cream or sugar, if desired.    Midmorning Snack  2 SCRAMBLED OR HARD BOILED EGG   Lunch  1 cup cream soup.    cup fruit juice.   1 cup milk.    cup custard.   1 cup beverage (coffee or tea).   Cream or sugar, if desired.    Midafternoon Snack  1 cup milk shake.  Dinner  1 cup cream soup.    cup fruit juice.   1 cup MILK    cup pudding.   1 cup beverage (coffee or tea).   Cream or sugar, if desired.  Evening Snack  1 cup supplement.  To increase calories, add sugar, cream, butter, or margarine if possible. Nutritional supplements will also increase the total calories.

## 2015-07-27 NOTE — Assessment & Plan Note (Signed)
SYMPTOMS NOT CONTROLLED. DIFFERENTIAL DIAGNOSIS INCLUDES IBD, COLITIS, & LESS LIKELY COLON CA.  TCS/HEMORRHOID BANDING MAY 12-DISCUSSED PROCEDURE, BENEFITS, & RISKS: < 1% chance of medication reaction, PERFORATION, PELVIC VEIN SEPSIS, OR bleeding. FULL LIQUIDS WITH BREAKFAST MAY 8. SUPREP.  CT SCAN WITHIN 3-5 DAYS.  OPV TBS SCHEDULED AFTER ENDOSCOPY.

## 2015-07-27 NOTE — Progress Notes (Addendum)
Subjective:    Patient ID: Cynthia Chase, female    DOB: [birth year], 35 y.o.   MRN: [id number]  Cynthia Chase,Cynthia W, MD  HPI HAVING TROUBLE WITH RLQ AND HAS HSY/CLEANED OUT ENDOMETRIOSIS. STILL HAVING PAIN AFTER HSY. PAIN BETTER BUT NOT RESOLVED. FEB 2017: PROBLEMS WITH CONSTIPATION AND HAD BM AND TOILET WAS RED. HAPPENED AND OVER A WEEK IT GOT BETTER. SINCE THEN ON AND OFF IT KEEPS HAPPENING BUT NOT WITH EVERY BM. EITHER CONSTIPATED AND HAD DIARRHEA. HAD BM x 3 AND TOILET WAS RED.  THIS AM A LITTLE BLEEDING BUT NOT NIGHT BEFORE. RECTAL PRESSURE/PAIN: 1ST TIME. NO RECTAL ITCHING, BURNING, OR SOILING. SUN: RLQ ASSOCIATED WITH NAUSEA AND IT GOT BETTER. MON: A LITTLE PAIN/NAUSEA IN AM, ATE AND IT GOT BETTER. AFTER BM: SORENESS IN RLQ. OCCASIONAL ADVIL/IBUPROFEN-1-2X/WEEK. RARE EXCEDRIN/ASPIRIN. NO BC/GOODY POWDERS.  PT DENIES FEVER, CHILLS, HEMATEMESIS, nausea, vomiting, melena, CHEST PAIN, SHORTNESS OF BREATH,  CHANGE IN BOWEL IN HABITS, problems swallowing, problems with sedation, OR heartburn or indigestion. DOESN'T DRINK A LOT OF MILK. ICE CREAM/CHEESE. DOESN'T HAVE STRESS.   Past Medical History  Diagnosis Date  . Shingles     3rd and 4th grade  . Varicella     as a child  . Abnormal Pap smear     2006  . Headache     Migraines  . Arthritis     Osteoarthritis  . Anemia     With pregnancy  . PONV (postoperative nausea and vomiting)    Past Surgical History  Procedure Laterality Date  . Knee surgery    . Dilation and curettage of uterus    . Arthroscopic repair acl    . Wisdom tooth extraction    . Foot surgery    . Laparoscopic assisted vaginal hysterectomy N/A 01/03/2015    Procedure: LAPAROSCOPIC ASSISTED VAGINAL HYSTERECTOMY;  Surgeon: Cynthia MoralesVanessa P Haygood, MD;  Location: [facility];  Service: Gynecology;  Laterality: N/A;  . Bilateral salpingectomy Bilateral 01/03/2015    Procedure: BILATERAL SALPINGECTOMY;  Surgeon: Cynthia MoralesVanessa P Haygood, MD;  Location: [facility];  Service: Gynecology;   Laterality: Bilateral;   Allergies  Allergen Reactions  . Gentamicin   . Sulfa Antibiotics Rash   Current Outpatient Prescriptions  Medication Sig Dispense Refill  . ADVIL,MOTRIN 600 MG tablet 1  po pc every 6 hours for 5 days then prn-pain    .      .      .      .       Family History  Problem Relation Age of Onset  . Hyperlipidemia Mother   . Cancer Mother   . Cancer Maternal Grandfather   . Colon cancer Neg Hx   . Ulcerative colitis Neg Hx   . Crohn's disease Neg Hx   . Colon polyps Father    Social History   Social History  . Marital Status: Married    Spouse Name: N/A  . Number of Children: N/A  . Years of Education: N/A   Social History Main Topics  . Smoking status: Never Smoker   . Smokeless tobacco: Never Used     Comment: Never smoked  . Alcohol Use: 0.0 oz/week    0 Standard drinks or equivalent per week     Comment: occassional  . Drug Use: No  . Sexual Activity: Not Asked   Other Topics Concern  . None   Social History Narrative   SELLS INSURANCE FOR A LIVING. 2 KIDS(4,7)-MARRIED. BORN AND RAISED IN  [place]. WENT TO [place] AND PLAYED BASKETBALL.    Review of Systems PER HPI OTHERWISE ALL SYSTEMS ARE NEGATIVE.    Objective:   Physical Exam  Constitutional: She is oriented to person, place, and time. She appears well-developed and well-nourished. No distress.  HENT:  Head: Normocephalic and atraumatic.  Mouth/Throat: Oropharynx is clear and moist. No oropharyngeal exudate.  Eyes: Pupils are equal, round, and reactive to light. No scleral icterus.  Neck: Normal range of motion. Neck supple.  Cardiovascular: Normal rate, regular rhythm and normal heart sounds.   Pulmonary/Chest: Effort normal and breath sounds normal. No respiratory distress.  Abdominal: Soft. Bowel sounds are normal. She exhibits no distension. There is no tenderness.  Musculoskeletal: She exhibits no edema.  Lymphadenopathy:    She has no cervical adenopathy.    Neurological: She is alert and oriented to person, place, and time.  NO FOCAL DEFICITS  Psychiatric: She has a normal mood and affect.  Vitals reviewed.     Assessment & Plan:

## 2015-07-27 NOTE — Assessment & Plan Note (Signed)
SYMPTOMS NOT CONTROLLED AND INTERMITTENT. DIFFERENTIAL DIAGNOSIS INCLUDES: MICROSCOPIC COLITIS. LESS LIKELY CELIAC SPRUE, THYROID DISTURBANCE, GIARDIASIS, C DIFF COLITIS, OR IBD.  SYMPTOMS NOT CONTROLLED. DIFFERENTIAL DIAGNOSIS INCLUDES HEMORRHOIDS, COLON POLYPS, AVMs, & LESS LIKELY COLON CA.  TCS/HEMORRHOID BANDING MAY 12-DISCUSSED PROCEDURE, BENEFITS, & RISKS: < 1% chance of medication reaction, PERFORATION, PELVIC VEIN SEPSIS, OR bleeding. FULL LIQUIDS WITH BREAKFAST MAY 8. SUPREP.  CT SCAN WITHIN 3-5 DAYS.  OPV TBS SCHEDULED AFTER ENDOSCOPY.

## 2015-07-27 NOTE — Progress Notes (Signed)
CC'ED TO PCP 

## 2015-07-27 NOTE — Assessment & Plan Note (Addendum)
SYMPTOMS NOT CONTROLLED. DIFFERENTIAL DIAGNOSIS INCLUDES HEMORRHOIDS, COLON POLYPS, AVMs, & LESS LIKELY COLON CA.  TCS/HEMORRHOID BANDING MAY 12-DISCUSSED PROCEDURE, BENEFITS, & RISKS: < 1% chance of medication reaction, PERFORATION, PELVIC VEIN SEPSIS, OR bleeding. FULL LIQUIDS WITH BREAKFAST MAY 8. SUPREP.  CT SCAN WITHIN 3-5 DAYS.  OPV TBS SCHEDULED AFTER ENDOSCOPY.  GREATER THAN 50% WAS SPENT IN COUNSELING & COORDINATION OF CARE WITH THE PATIENT: DISCUSSED DIFFERENTIAL DIAGNOSIS, PROCEDURE, BENEFITS, RISKS, AND MANAGEMENT OF RECTAL BLEEDING/ABDOMINAL PAIN/DIARRHEA. TOTAL ENCOUNTER TIME: 45 MINS.

## 2015-07-27 NOTE — Telephone Encounter (Signed)
Called [employer] to obtain PA for CT scan. Not approved. Needs Peer-to-peer review.  Call [phone] follow prompts to clinical reivew. ID # [id number].

## 2015-07-28 NOTE — Telephone Encounter (Signed)
Called pt and was unable to leave message . Mailbox full

## 2015-07-31 NOTE — Telephone Encounter (Signed)
Spoke with pt told her insurance is still pending

## 2015-08-01 NOTE — Telephone Encounter (Signed)
PLEASE CALL PT. WE WILL PROCEED WITH TCS FIRST AND IF NO SOURCE FOR HER PAIN IS IDENTIFIED THEN WE WILL PETITION HER INSURANCE CO TO APPROVE THE CT SCAN.

## 2015-08-01 NOTE — Telephone Encounter (Signed)
Pt is aware of holding the CT scan for now

## 2015-08-04 ENCOUNTER — Ambulatory Visit (HOSPITAL_COMMUNITY)
Admission: RE | Admit: 2015-08-04 | Discharge: 2015-08-04 | Disposition: A | Discharge status: Home / Self Care | Payer: BC Managed Care – PPO | Source: Ambulatory Visit | Attending: Gastroenterology | Admitting: Gastroenterology

## 2015-08-04 ENCOUNTER — Encounter (HOSPITAL_COMMUNITY): Payer: Self-pay | Admitting: *Deleted

## 2015-08-04 ENCOUNTER — Telehealth: Payer: Self-pay

## 2015-08-04 ENCOUNTER — Encounter (HOSPITAL_COMMUNITY)
Admission: RE | Disposition: A | Discharge status: Home / Self Care | Payer: Self-pay | Source: Ambulatory Visit | Attending: Gastroenterology

## 2015-08-04 DIAGNOSIS — R1031 Right lower quadrant pain: Secondary | ICD-10-CM | POA: Insufficient documentation

## 2015-08-04 DIAGNOSIS — K602 Anal fissure, unspecified: Secondary | ICD-10-CM | POA: Diagnosis not present

## 2015-08-04 DIAGNOSIS — R197 Diarrhea, unspecified: Secondary | ICD-10-CM | POA: Diagnosis not present

## 2015-08-04 DIAGNOSIS — Q438 Other specified congenital malformations of intestine: Secondary | ICD-10-CM | POA: Diagnosis not present

## 2015-08-04 DIAGNOSIS — K648 Other hemorrhoids: Secondary | ICD-10-CM | POA: Diagnosis not present

## 2015-08-04 DIAGNOSIS — K644 Residual hemorrhoidal skin tags: Secondary | ICD-10-CM | POA: Insufficient documentation

## 2015-08-04 DIAGNOSIS — K921 Melena: Secondary | ICD-10-CM | POA: Diagnosis not present

## 2015-08-04 DIAGNOSIS — K625 Hemorrhage of anus and rectum: Secondary | ICD-10-CM | POA: Insufficient documentation

## 2015-08-04 DIAGNOSIS — M1991 Primary osteoarthritis, unspecified site: Secondary | ICD-10-CM | POA: Diagnosis not present

## 2015-08-04 DIAGNOSIS — Z800 Family history of malignant neoplasm of digestive organs: Secondary | ICD-10-CM | POA: Insufficient documentation

## 2015-08-04 HISTORY — PX: COLONOSCOPY: SHX5424

## 2015-08-04 SURGERY — COLONOSCOPY
Anesthesia: Moderate Sedation

## 2015-08-04 MED ORDER — SODIUM CHLORIDE 0.9 % IV SOLN
INTRAVENOUS | Status: DC
  Administered 2015-08-04: 1000 mL via INTRAVENOUS

## 2015-08-04 MED ORDER — MIDAZOLAM HCL 5 MG/5ML IJ SOLN
INTRAMUSCULAR | Status: AC
  Filled 2015-08-04: qty 10

## 2015-08-04 MED ORDER — ONDANSETRON 4 MG PO TBDP: ORAL_TABLET | ORAL | Status: DC

## 2015-08-04 MED ORDER — ONDANSETRON HCL 4 MG/2ML IJ SOLN
INTRAMUSCULAR | Status: AC
  Filled 2015-08-04: qty 2

## 2015-08-04 MED ORDER — ONDANSETRON HCL 4 MG/2ML IJ SOLN
INTRAMUSCULAR | Status: DC | PRN
  Administered 2015-08-04: 4 mg via INTRAVENOUS

## 2015-08-04 MED ORDER — NITROGLYCERIN 0.4 % RE OINT: TOPICAL_OINTMENT | RECTAL | Status: DC

## 2015-08-04 MED ORDER — MIDAZOLAM HCL 5 MG/5ML IJ SOLN
INTRAMUSCULAR | Status: DC | PRN
  Administered 2015-08-04 (×3): 2 mg via INTRAVENOUS
  Administered 2015-08-04: 1 mg via INTRAVENOUS

## 2015-08-04 MED ORDER — MEPERIDINE HCL 100 MG/ML IJ SOLN
INTRAMUSCULAR | Status: AC
  Filled 2015-08-04: qty 2

## 2015-08-04 MED ORDER — MEPERIDINE HCL 100 MG/ML IJ SOLN
INTRAMUSCULAR | Status: DC | PRN
  Administered 2015-08-04 (×2): 25 mg via INTRAVENOUS

## 2015-08-04 MED ORDER — STERILE WATER FOR IRRIGATION IR SOLN
Status: DC | PRN
  Administered 2015-08-04: 09:00:00

## 2015-08-04 NOTE — Interval H&P Note (Signed)
History and Physical Interval Note:  08/04/2015 8:51 AM  Cynthia MuscatKatherine R Deschepper  has presented today for surgery, with the diagnosis of RECTAL BLEEDING, DIARRHEA  The various methods of treatment have been discussed with the patient and family. After consideration of risks, benefits and other options for treatment, the patient has consented to  Procedure(s) with comments: COLONOSCOPY (N/A) - 0830 HEMORRHOID BANDING (N/A) as a surgical intervention .  The patient's history has been reviewed, patient examined, no change in status, stable for surgery.  I have reviewed the patient's chart and labs.  Questions were answered to the patient's satisfaction.     [employer] CorporationSandi Fields

## 2015-08-04 NOTE — Discharge Instructions (Signed)
You have internal hemorrhoids. YOU DID NOT HAVE ANY POLYPS. NO OBVIOUS SOURCE FOR YOUR DIARRHEA/ABDOMINAL PAIN WAS IDENTIFIED.  YOUR RECTAL BLEEDING IS DUE TO SMALL INTERNAL HEMORRHOIDS AND AN ANAL FISSURE.   DRINK WATER TO KEEP YOUR URINE LIGHT YELLOW.  FOLLOW A HIGH FIBER DIET. AVOID ITEMS THAT CAUSE BLOATING. SEE INFO BELOW.  USE COLACE 100 MG THREE TIMES A DAY TO SOFTEN STOOL FOR 14 DAYS.  ADD Nitroglycerin ointment 0.4 %. USE a pea sized amount internally four times daily FOR THREE MONTH. IT MAY CAUSE HEADACHES OR DROP IN BLOOD PRESSURE. TAKE FIRST DOSE AND REMAIN SEATED OR LAYING DOWN FOR 10-15 MINS.  YOUR BIOPSY RESULTS WILL BE AVAILABLE IN MY CHART MAY 14 AND MY OFFICE WILL CONTACT YOU IN 10-14 DAYS WITH YOUR RESULTS.   I WILL CALL THE INSURANCE COMPANY AND GET APPROVAL FOR CT SCAN.  PLEASE CALL IN ONE MONTH IF SYMPTOMS ARE NOT IMPROVED.   Colonoscopy Care After Read the instructions outlined below and refer to this sheet in the next week. These discharge instructions provide you with general information on caring for yourself after you leave the hospital. While your treatment has been planned according to the most current medical practices available, unavoidable complications occasionally occur. If you have any problems or questions after discharge, call DR. Johnye Kist, [phone].  ACTIVITY  You may resume your regular activity, but move at a slower pace for the next 24 hours.   Take frequent rest periods for the next 24 hours.   Walking will help get rid of the air and reduce the bloated feeling in your belly (abdomen).   No driving for 24 hours (because of the medicine (anesthesia) used during the test).   You may shower.   Do not sign any important legal documents or operate any machinery for 24 hours (because of the anesthesia used during the test).    NUTRITION  Drink plenty of fluids.   You may resume your normal diet as instructed by your doctor.   Begin with  a light meal and progress to your normal diet. Heavy or fried foods are harder to digest and may make you feel sick to your stomach (nauseated).   Avoid alcoholic beverages for 24 hours or as instructed.    MEDICATIONS  You may resume your normal medications.   WHAT YOU CAN EXPECT TODAY  Some feelings of bloating in the abdomen.   Passage of more gas than usual.   Spotting of blood in your stool or on the toilet paper  .  IF YOU HAD POLYPS REMOVED DURING THE COLONOSCOPY:  Eat a soft diet IF YOU HAVE NAUSEA, BLOATING, ABDOMINAL PAIN, OR VOMITING.    FINDING OUT THE RESULTS OF YOUR TEST Not all test results are available during your visit. DR. Darrick Penna WILL CALL YOU WITHIN 7 DAYS OF YOUR PROCEDUE WITH YOUR RESULTS. Do not assume everything is normal if you have not heard from DR. Kenly Henckel IN ONE WEEK, CALL HER OFFICE AT [phone].  SEEK IMMEDIATE MEDICAL ATTENTION AND CALL THE OFFICE: [phone] IF:  You have more than a spotting of blood in your stool.   Your belly is swollen (abdominal distention).   You are nauseated or vomiting.   You have a temperature over 101F.   You have abdominal pain or discomfort that is severe or gets worse throughout the day.  High-Fiber Diet A high-fiber diet changes your normal diet to include more whole grains, legumes, fruits, and vegetables. Changes in the diet  involve replacing refined carbohydrates with unrefined foods. The calorie level of the diet is essentially unchanged. The Dietary Reference Intake (recommended amount) for adult males is 38 grams per day. For adult females, it is 25 grams per day. Pregnant and lactating women should consume 28 grams of fiber per day. Fiber is the intact part of a plant that is not broken down during digestion. Functional fiber is fiber that has been isolated from the plant to provide a beneficial effect in the body. PURPOSE  Increase stool bulk.   Ease and regulate bowel movements.   Lower  cholesterol.  INDICATIONS THAT YOU NEED MORE FIBER  Constipation and hemorrhoids.   Uncomplicated diverticulosis (intestine condition) and irritable bowel syndrome.   Weight management.   As a protective measure against hardening of the arteries (atherosclerosis), diabetes, and cancer.   GUIDELINES FOR INCREASING FIBER IN THE DIET  Start adding fiber to the diet slowly. A gradual increase of about 5 more grams (2 slices of whole-wheat bread, 2 servings of most fruits or vegetables, or 1 bowl of high-fiber cereal) per day is best. Too rapid an increase in fiber may result in constipation, flatulence, and bloating.   Drink enough water and fluids to keep your urine clear or pale yellow. Water, juice, or caffeine-free drinks are recommended. Not drinking enough fluid may cause constipation.   Eat a variety of high-fiber foods rather than one type of fiber.   Try to increase your intake of fiber through using high-fiber foods rather than fiber pills or supplements that contain small amounts of fiber.   The goal is to change the types of food eaten. Do not supplement your present diet with high-fiber foods, but replace foods in your present diet.  INCLUDE A VARIETY OF FIBER SOURCES  Replace refined and processed grains with whole grains, canned fruits with fresh fruits, and incorporate other fiber sources. White rice, white breads, and most bakery goods contain little or no fiber.   Brown whole-grain rice, buckwheat oats, and many fruits and vegetables are all good sources of fiber. These include: broccoli, Brussels sprouts, cabbage, cauliflower, beets, sweet potatoes, white potatoes (skin on), carrots, tomatoes, eggplant, squash, berries, fresh fruits, and dried fruits.   Cereals appear to be the richest source of fiber. Cereal fiber is found in whole grains and bran. Bran is the fiber-rich outer coat of cereal grain, which is largely removed in refining. In whole-grain cereals, the bran  remains. In breakfast cereals, the largest amount of fiber is found in those with "bran" in their names. The fiber content is sometimes indicated on the label.   You may need to include additional fruits and vegetables each day.   In baking, for 1 cup white flour, you may use the following substitutions:   1 cup whole-wheat flour minus 2 tablespoons.   1/2 cup white flour plus 1/2 cup whole-wheat flour.   Hemorrhoids Hemorrhoids are dilated (enlarged) veins around the rectum. Sometimes clots will form in the veins. This makes them swollen and painful. These are called thrombosed hemorrhoids. Causes of hemorrhoids include:  Constipation.   Straining to have a bowel movement.   HEAVY LIFTING  HOME CARE INSTRUCTIONS  Eat a well balanced diet and drink 6 to 8 glasses of water every day to avoid constipation. You may also use a bulk laxative.   Avoid straining to have bowel movements.   Keep anal area dry and clean.   Do not use a donut shaped pillow or sit on  the toilet for long periods. This increases blood pooling and pain.   Move your bowels when your body has the urge; this will require less straining and will decrease pain and pressure.

## 2015-08-04 NOTE — H&P (View-Only) (Signed)
Subjective:    Patient ID: Cynthia Chase, female    DOB: 1980-04-12, 35 y.o.   MRN: [id number]  Cynthia Chase,Cynthia W, MD  HPI HAVING TROUBLE WITH RLQ AND HAS HSY/CLEANED OUT ENDOMETRIOSIS. STILL HAVING PAIN AFTER HSY. PAIN BETTER BUT NOT RESOLVED. FEB 2017: PROBLEMS WITH CONSTIPATION AND HAD BM AND TOILET WAS RED. HAPPENED AND OVER A WEEK IT GOT BETTER. SINCE THEN ON AND OFF IT KEEPS HAPPENING BUT NOT WITH EVERY BM. EITHER CONSTIPATED AND HAD DIARRHEA. HAD BM x 3 AND TOILET WAS RED.  THIS AM A LITTLE BLEEDING BUT NOT NIGHT BEFORE. RECTAL PRESSURE/PAIN: 1ST TIME. NO RECTAL ITCHING, BURNING, OR SOILING. SUN: RLQ ASSOCIATED WITH NAUSEA AND IT GOT BETTER. MON: A LITTLE PAIN/NAUSEA IN AM, ATE AND IT GOT BETTER. AFTER BM: SORENESS IN RLQ. OCCASIONAL ADVIL/IBUPROFEN-1-2X/WEEK. RARE EXCEDRIN/ASPIRIN. NO BC/GOODY POWDERS.  PT DENIES FEVER, CHILLS, HEMATOCHEZIA, HEMATEMESIS, nausea, vomiting, melena, diarrhea, CHEST PAIN, SHORTNESS OF BREATH,  CHANGE IN BOWEL IN HABITS, constipation, abdominal pain, problems swallowing, problems with sedation, heartburn or indigestion. DOESN'T DRINK A LOT OF MILK. ICE CREAM/CHEESE. DOESN'T HAVE STRESS.   Past Medical History  Diagnosis Date  . Shingles     3rd and 4th grade  . Varicella     as a child  . Abnormal Pap smear     2006  . Headache     Migraines  . Arthritis     Osteoarthritis  . Anemia     With pregnancy  . PONV (postoperative nausea and vomiting)    Past Surgical History  Procedure Laterality Date  . Knee surgery    . Dilation and curettage of uterus    . Arthroscopic repair acl    . Wisdom tooth extraction    . Foot surgery    . Laparoscopic assisted vaginal hysterectomy N/A 01/03/2015    Procedure: LAPAROSCOPIC ASSISTED VAGINAL HYSTERECTOMY;  Surgeon: Cynthia MoralesVanessa P Haygood, MD;  Location: [facility];  Service: Gynecology;  Laterality: N/A;  . Bilateral salpingectomy Bilateral 01/03/2015    Procedure: BILATERAL SALPINGECTOMY;  Surgeon: Cynthia MoralesVanessa P  Haygood, MD;  Location: [facility];  Service: Gynecology;  Laterality: Bilateral;   Allergies  Allergen Reactions  . Gentamicin   . Sulfa Antibiotics Rash   Current Outpatient Prescriptions  Medication Sig Dispense Refill  . ADVIL,MOTRIN 600 MG tablet 1  po pc every 6 hours for 5 days then prn-pain    .      .      .      .       Family History  Problem Relation Age of Onset  . Hyperlipidemia Mother   . Cancer Mother   . Cancer Maternal Grandfather   . Colon cancer Neg Hx   . Ulcerative colitis Neg Hx   . Crohn's disease Neg Hx   . Colon polyps Father    Social History   Social History  . Marital Status: Married    Spouse Name: N/A  . Number of Children: N/A  . Years of Education: N/A   Social History Main Topics  . Smoking status: Never Smoker   . Smokeless tobacco: Never Used     Comment: Never smoked  . Alcohol Use: 0.0 oz/week    0 Standard drinks or equivalent per week     Comment: occassional  . Drug Use: No  . Sexual Activity: Not Asked   Other Topics Concern  . None   Social History Narrative   SELLS INSURANCE FOR A LIVING. 2 KIDS(4,7)-MARRIED.  BORN AND RAISED IN [place]. WENT TO [place] AND PLAYED BASKETBALL.    Review of Systems PER HPI OTHERWISE ALL SYSTEMS ARE NEGATIVE.    Objective:   Physical Exam  Constitutional: She is oriented to person, place, and time. She appears well-developed and well-nourished. No distress.  HENT:  Head: Normocephalic and atraumatic.  Mouth/Throat: Oropharynx is clear and moist. No oropharyngeal exudate.  Eyes: Pupils are equal, round, and reactive to light. No scleral icterus.  Neck: Normal range of motion. Neck supple.  Cardiovascular: Normal rate, regular rhythm and normal heart sounds.   Pulmonary/Chest: Effort normal and breath sounds normal. No respiratory distress.  Abdominal: Soft. Bowel sounds are normal. She exhibits no distension. There is no tenderness.  Musculoskeletal: She exhibits no edema.    Lymphadenopathy:    She has no cervical adenopathy.  Neurological: She is alert and oriented to person, place, and time.  NO FOCAL DEFICITS  Psychiatric: She has a normal mood and affect.  Vitals reviewed.     Assessment & Plan:

## 2015-08-04 NOTE — Telephone Encounter (Signed)
CALLED FOR PEER TO PEER REVIEW. CASE closed ON MAY 8. NOW NEEDS APPEAL @[name]. TRANSFERRED TO APPEAL LINE. LEFT INFO FOR THE APPEAL. WILL CONTACT CANDY Cynthia Chase REGARDING THE APPEAL.

## 2015-08-04 NOTE — Telephone Encounter (Signed)
Pharmacist from [facility] called- pt has a high deductible insurance plan and nitro ointment is $500. They want to know if they can change to something else or give her their compounded hemorrhoid cream. The ingredients in the cream is : oxymatolazone, hydrocortisone, allantlin powder, titricaine, alcohol and propralene glycol. [facility] phone number is [phone]

## 2015-08-05 NOTE — Op Note (Signed)
[facility] Patient Name: Cynthia Chase Procedure Date: 08/04/2015 8:22 AM MRN: [id number] Date of Birth: 22-Aug-1980 Attending MD: Jonette Eva , MD CSN: [id number] Age: 35 Admit Type: Outpatient Procedure:                Colonoscopy WITH RANDOM COLD BIOPSIES Indications:              Abdominal pain in the right lower quadrant,                            Clinically significant diarrhea of unexplained                            origin, Hematochezia Providers:                Jonette Eva, MD, Jannett Celestine, RN, Birder Robson,                            Technician Referring MD:             [name] Medicines:                Ondansetron 4 mg IV, Meperidine 50 mg IV, Midazolam                            7 mg IV Complications:            No immediate complications. Estimated Blood Loss:     Estimated blood loss was minimal. Procedure:                Pre-Anesthesia Assessment:                           - Prior to the procedure, a History and Physical                            was performed, and patient medications and                            allergies were reviewed. The patient's tolerance of                            previous anesthesia was also reviewed. The risks                            and benefits of the procedure and the sedation                            options and risks were discussed with the patient.                            All questions were answered, and informed consent                            was obtained. Prior Anticoagulants: The patient has  taken ibuprofen, last dose was day of procedure.                            ASA Grade Assessment: I - A normal, healthy                            patient. After reviewing the risks and benefits,                            the patient was deemed in satisfactory condition to                            undergo the procedure.                           After obtaining informed consent,  the colonoscope                            was passed under direct vision. Throughout the                            procedure, the patient's blood pressure, pulse, and                            oxygen saturations were monitored continuously. The                            [id number] ([id number]) scope was introduced through                            the anus and advanced to the the terminal ileum.                            The [id number] ([id number]) scope was introduced through                            the anus and advanced to the the terminal ileum.                            The terminal ileum, ileocecal valve, appendiceal                            orifice, and rectum were photographed. The                            colonoscopy was somewhat difficult due to a                            tortuous colon. Successful completion of the                            procedure was aided by straightening and shortening  the scope to obtain bowel loop reduction. The                            quality of the bowel preparation was excellent. Scope In: 9:12:17 AM Scope Out: 9:28:59 AM Total Procedure Duration: 0 hours 16 minutes 42 seconds  Findings:      Non-bleeding external and internal hemorrhoids were found.      The digital rectal exam findings include anal fissure IN POSTERIO.       Pertinent negatives include normal sphincter tone.      The recto-sigmoid colon was moderately redundant. Biopsies for histology       were taken with a cold forceps from the cecum, ascending colon,       transverse colon, descending colon, sigmoid colon and rectum for       evaluation of microscopic colitis.      The exam was otherwise without abnormality. Impression:               - Non-bleeding external and internal hemorrhoids.                           - RECTAL BLEEDING DUE TO Anal fissure/HEMORRHOIDS                           - Redundant colon.                           - NO SOURCE  FOR ABDOMINAL PAIN/DIARRHEA IDENTIFIED Moderate Sedation:      Moderate (conscious) sedation was administered by the endoscopy nurse       and supervised by the endoscopist. The following parameters were       monitored: oxygen saturation, heart rate, blood pressure, and response       to care. Total physician intraservice time was 32 minutes. Recommendation:           - High fiber diet.                           - Await pathology results.                           - Repeat colonoscopy at age 35 for surveillance.                           DRINK WATER TO KEEP YOUR URINE LIGHT YELLOW.                           FOLLOW A HIGH FIBER DIET. AVOID ITEMS THAT CAUSE                            BLOATING.                           USE COLACE 100 MG THREE TIMES A DAY TO SOFTEN STOOL                            FOR 14 DAYS.  ADD Nitroglycerin ointment 0.4 %. USE a pea sized                            amount internally four times daily FOR THREE MONTH.                            IT MAY CAUSE HEADACHES OR DROP IN BLOOD PRESSURE.                            TAKE FIRST DOSE AND REMAIN SEATED OR LAYING DOWN                            FOR 10-15 MINS.                           WILL CALL THE INSURANCE COMPANY AND GET APPROVAL                            FOR CT SCAN.                           CALL IN ONE MONTH IF SYMPTOMS ARE NOT IMPROVED.                           - Patient has a contact number available for                            emergencies. The signs and symptoms of potential                            delayed complications were discussed with the                            patient. Return to normal activities tomorrow.                            Written discharge instructions were provided to the                            patient.                           - Continue present medications. Procedure Code(s):        --- Professional ---                           [phone], Colonoscopy,  flexible; with biopsy, single                            or multiple                           99153, Moderate sedation services; each additional  15 minutes intraservice time                           G0500, Moderate sedation services provided by the                            same physician or other qualified health care                            professional performing a gastrointestinal                            endoscopic service that sedation supports,                            requiring the presence of an independent trained                            observer to assist in the monitoring of the                            patient's level of consciousness and physiological                            status; initial 15 minutes of intra-service time;                            patient age 35 years or older (additional time may                            be reported with 16109, as appropriate) Diagnosis Code(s):        --- Professional ---                           K60.2, Anal fissure, unspecified                           K64.8, Other hemorrhoids                           R10.31, Right lower quadrant pain                           R19.7, Diarrhea, unspecified                           K92.1, Melena (includes Hematochezia)                           Q43.8, Other specified congenital malformations of                            intestine CPT copyright 2016 American Medical Association. All rights reserved. The codes documented in this report are preliminary and upon coder review may  be revised to meet current compliance requirements. Jonette Eva, MD Jonette Eva, MD 08/04/2015 10:46:25 PM This  report has been signed electronically. Number of Addenda: 0

## 2015-08-07 NOTE — Telephone Encounter (Signed)
Spoke with Noreene LarssonJill at [employer]. Case is back opened. Faxed clinical and procedure notes to see if case can get approved.

## 2015-08-07 NOTE — Telephone Encounter (Signed)
Noreene LarssonJill from [employer] called and states that the CT scan for the patient has been approved. Valid til 06/14. PA # is [id number].   Called pt and LMOM to call office back regarding scheduling.

## 2015-08-07 NOTE — Telephone Encounter (Signed)
Pt is aware of Ct appt. States that the RX that [facility] sent in Nitroglycerin ointment and it was too expensive (500.00) Is there an alternative that pt can use

## 2015-08-07 NOTE — Telephone Encounter (Signed)
Pt called back and she set for CT on 05/18 @ 0915. Pt is aware to pick up oral contrast

## 2015-08-08 NOTE — Telephone Encounter (Signed)
PLEASE CALL PT'S PHARMCY. NO ALTERNATIVE MED EXISTS FOR ANAL FISSURE.

## 2015-08-09 ENCOUNTER — Encounter (HOSPITAL_COMMUNITY): Payer: Self-pay | Admitting: Gastroenterology

## 2015-08-10 ENCOUNTER — Ambulatory Visit (HOSPITAL_COMMUNITY)
Admission: RE | Admit: 2015-08-10 | Discharge: 2015-08-10 | Disposition: A | Discharge status: Home / Self Care | Payer: BC Managed Care – PPO | Source: Ambulatory Visit | Attending: Gastroenterology | Admitting: Gastroenterology

## 2015-08-10 DIAGNOSIS — R1031 Right lower quadrant pain: Secondary | ICD-10-CM | POA: Insufficient documentation

## 2015-08-10 DIAGNOSIS — Z90710 Acquired absence of both cervix and uterus: Secondary | ICD-10-CM | POA: Diagnosis not present

## 2015-08-10 MED ORDER — NITROGLYCERIN 0.4 % RE OINT: TOPICAL_OINTMENT | RECTAL | Status: DC

## 2015-08-10 MED ORDER — IOPAMIDOL (ISOVUE-300) INJECTION 61%
100.0000 mL | Freq: Once | INTRAVENOUS | Status: AC | PRN
  Administered 2015-08-10: 100 mL via INTRAVENOUS

## 2015-08-10 NOTE — Telephone Encounter (Signed)
Is she uninsured? We can see about [facility] patient assistance OR have [facility] nitro oint.

## 2015-08-10 NOTE — Telephone Encounter (Signed)
Pt called and is aware there is not an alternative medication for the anal fissure. She said she will work it out to pick up the prescription.

## 2015-08-10 NOTE — Telephone Encounter (Signed)
Forwarding to Leslie Lewis, PA in Dr. Fields absence.

## 2015-08-10 NOTE — Telephone Encounter (Signed)
VM today the pharmacy asking for a different medication. The ointment would be in excess of $500.00. He is suggesting proto cream 2.5%. I saw the previous note and told him that Dr. Darrick PennaFields said there is no alternative med for the anal fissure. He said he will let the pt know.

## 2015-08-10 NOTE — Telephone Encounter (Signed)
Pt is aware.  

## 2015-08-10 NOTE — Telephone Encounter (Signed)
Pt said [facility] but she has a high deductible. She would like for you to [facility] the [facility] if you would.

## 2015-08-10 NOTE — Telephone Encounter (Signed)
I sent RX to [facility] I used the original strength recommended by [facility]. If still too expensive, we could consider reducing the dose.

## 2015-08-11 NOTE — Telephone Encounter (Signed)
REVIEWED. AGREE. NO ADDITIONAL RECOMMENDATIONS. 

## 2015-08-17 ENCOUNTER — Telehealth: Payer: Self-pay | Admitting: Gastroenterology

## 2015-08-17 NOTE — Telephone Encounter (Signed)
Pt called this afternoon to say that she seen her results on mychart for her CT done last week and wants SF to call her back. I told her that SF was not available but I could put her to the nurse's VM She agreed and call was transferred.

## 2015-08-17 NOTE — Telephone Encounter (Signed)
I called pt and read the impression to her. She has questions for Dr. Darrick PennaFields on the last sentence about the recommendation.

## 2015-08-18 NOTE — Telephone Encounter (Addendum)
I PERSONALLY REVIEWED THE CT WITH DR. Gaylord ShihBOLES-NL APPENDIX/OVARIES, NO MASS OR HERNIA. Called patient TO DISCUSS RESULTS. Her colon AND RECTAL biopsies are normal.  SHE SHOULD SEE DR. HAYGOOD TO MANAGE ENDOMTERIOSIS AND CONSIDER EXPLORATORY LAPAROTOMY. ALL NOTES AND TELEPHONE CALLS TO DR. VANESSA HAYGOOD. OPV I 3 MOS W/ [facility] E30 RLQ ABDOMINAL PAIN, ANAL FISSURE. CANT' TOLERATE NTG OINTMENT DURING THE DAY DUE TO HA.

## 2015-08-22 NOTE — Telephone Encounter (Signed)
Reminder in epic °

## 2015-09-20 ENCOUNTER — Encounter: Payer: Self-pay | Admitting: Gastroenterology

## 2015-11-29 ENCOUNTER — Ambulatory Visit (INDEPENDENT_AMBULATORY_CARE_PROVIDER_SITE_OTHER): Payer: BC Managed Care – PPO | Admitting: Gastroenterology

## 2015-11-29 DIAGNOSIS — K602 Anal fissure, unspecified: Secondary | ICD-10-CM | POA: Diagnosis not present

## 2015-11-29 DIAGNOSIS — K5901 Slow transit constipation: Secondary | ICD-10-CM

## 2015-11-29 DIAGNOSIS — K5900 Constipation, unspecified: Secondary | ICD-10-CM | POA: Insufficient documentation

## 2015-11-29 NOTE — Assessment & Plan Note (Signed)
AFTER VIRAL GASTROENTERITIS, IDIOPATHIC AND LESS LIKELY IBS.  DRINK WATER TO KEEP YOUR URINE LIGHT YELLOW.  FOLLOW A HIGH FIBER DIET. AVOID ITEMS THAT CAUSE BLOATING & GAS. SEE INFO BELOW.  OPEN LINZESS CAPSULE. PLACE GRANULES IN 4 TEASPOONS OF WATER(72 MCG). STIR IT FOR 30 SECONDS. TAKE 2 TSP OF THE WATER DAILY FOR 4 DAYS. IF NO SATISFACTORY BM THEN OPEN ANOTHER CAPSULE AND TAKE 3 TSP DAILY(108 MCG). YOU DO NOT NEED TO TAKE THE GRANULES THE MEDICINE IS IN THE WATER. IT MAY CAUSE EXPLOSIVE DIARRHEA.  PLEASE CALLWITH QUESTIONS OR CONCERNS.   FOLLOW UP IN 4 MOS.

## 2015-11-29 NOTE — Assessment & Plan Note (Addendum)
SYMPTOMS IMPROVED BUT PERSIST IN SETTING OF CONSTIPATION.  DRINK WATER EAT FIBER AVOID CONSTIPATION. FOLLOW UP IN 4 MOS.

## 2015-11-29 NOTE — Progress Notes (Signed)
   Subjective:    Patient ID: Cynthia Chase, female    DOB: 11/15/1980, 35 y.o.   MRN: [id number]  Estanislado PandySASSER,PAUL W, MD  HPI BLEEDING BETTER. [place] AND HAD WATERY STOOLS AND ABDOMINAL CRAMPS. SYMPTOMS LASTED FOR 6 DAYS. [place] FEVER LASTED. NOW PROBLEMS WITH CONSTIPATION. OCCASIONAL BRBPR: 1X/WEEK. BMs: ONCE A DAY WITH MIRALAX, PROBIOTICS, STOOL SOFTENERS. NTG MADE HER HAVE A HA AND SO SHE TOOK IT AT BEDTIME.   PT DENIES HEMATOCHEZIA, HEMATEMESIS, nausea, vomiting, melena, diarrhea, CHEST PAIN, SHORTNESS OF BREATH, CHANGE IN BOWEL IN HABITS, problems swallowing, problems with sedation, OR heartburn or indigestion.   Past Medical History:  Diagnosis Date  . Abnormal Pap smear    2006  . Anemia    With pregnancy  . Arthritis    Osteoarthritis  . Headache    Migraines  . PONV (postoperative nausea and vomiting)   . Shingles    3rd and 4th grade  . Varicella    as a child    Past Surgical History:  Procedure Laterality Date  . ABDOMINAL HYSTERECTOMY    . ADENOIDECTOMY    . ARTHROSCOPIC REPAIR ACL    . BILATERAL SALPINGECTOMY Bilateral 01/03/2015   Procedure: BILATERAL SALPINGECTOMY;  Surgeon: Hal MoralesVanessa P Haygood, MD;  Location: [facility];  Service: Gynecology;  Laterality: Bilateral;  . COLONOSCOPY N/A 08/04/2015   Procedure: COLONOSCOPY;  Surgeon: West BaliSandi L Doneshia Hill, MD;  Location: [facility];  Service: Endoscopy;  Laterality: N/A;  0830  . DILATION AND CURETTAGE OF UTERUS    . FOOT SURGERY    . KNEE SURGERY    . LAPAROSCOPIC ASSISTED VAGINAL HYSTERECTOMY N/A 01/03/2015   Procedure: LAPAROSCOPIC ASSISTED VAGINAL HYSTERECTOMY;  Surgeon: Hal MoralesVanessa P Haygood, MD;  Location: [facility];  Service: Gynecology;  Laterality: N/A;  . WISDOM TOOTH EXTRACTION      Allergies  Allergen Reactions  . Gentamicin   . Sulfa Antibiotics Rash   Current Outpatient Prescriptions  Medication Sig Dispense Refill  . docusate sodium (COLACE) 100 MG capsule Take 100 mg  by mouth 2 (two) times daily.    Marland Kitchen. ibuprofen (ADVIL,MOTRIN) 200 MG tablet Take 600 mg by mouth every 6 (six) hours as needed for moderate pain.    . medroxyPROGESTERone (PROVERA) 10 MG tablet     . meloxicam (MOBIC) 15 MG tablet     .      . ondansetron (ZOFRAN ODT) 4 MG disintegrating tablet 1-2 SL Q6H PRN NAUSEA OR VOMITING      Review of Systems PER HPI OTHERWISE ALL SYSTEMS ARE NEGATIVE.    Objective:   Physical Exam  Constitutional: She is oriented to person, place, and time. She appears well-developed and well-nourished. No distress.  HENT:  Head: Normocephalic and atraumatic.  Mouth/Throat: Oropharynx is clear and moist. No oropharyngeal exudate.  Eyes: Pupils are equal, round, and reactive to light. No scleral icterus.  Neck: Normal range of motion. Neck supple.  Cardiovascular: Normal rate, regular rhythm and normal heart sounds.   Pulmonary/Chest: Effort normal and breath sounds normal. No respiratory distress.  Abdominal: Soft. Bowel sounds are normal. She exhibits no distension. There is no tenderness.  Musculoskeletal: She exhibits no edema.  Lymphadenopathy:    She has no cervical adenopathy.  Neurological: She is alert and oriented to person, place, and time.  Psychiatric: She has a normal mood and affect.  Vitals reviewed.    Assessment & Plan:

## 2015-11-29 NOTE — Progress Notes (Signed)
ON RECALL  °

## 2015-11-29 NOTE — Patient Instructions (Addendum)
DRINK WATER TO KEEP YOUR URINE LIGHT YELLOW.  FOLLOW A HIGH FIBER DIET. AVOID ITEMS THAT CAUSE BLOATING & GAS. SEE INFO BELOW.  OPEN LINZESS CAPSULE. PLACE GRANULES IN 4 TEASPOONS OF WUJWJ(191WATER(145 MCG). STIR IT FOR 30 SECONDS. TAKE 2 TSP (72 MCG) OF THE WATER DAILY FOR 4 DAYS. IF NO SATISFACTORY BM THEN OPEN ANOTHER CAPSULE AND TAKE 3 TSP DAILY(108 MCG). YOU DO NOT NEED TO TAKE THE GRANULES THE MEDICINE IS IN THE WATER. IT MAY CAUSE EXPLOSIVE DIARRHEA.  PLEASE CALLWITH QUESTIONS OR CONCERNS.   FOLLOW UP IN 4 MOS.   High-Fiber Diet A high-fiber diet changes your normal diet to include more whole grains, legumes, fruits, and vegetables. Changes in the diet involve replacing refined carbohydrates with unrefined foods. The calorie level of the diet is essentially unchanged. The Dietary Reference Intake (recommended amount) for adult males is 38 grams per day. For adult females, it is 25 grams per day. Pregnant and lactating women should consume 28 grams of fiber per day. Fiber is the intact part of a plant that is not broken down during digestion. Functional fiber is fiber that has been isolated from the plant to provide a beneficial effect in the body. PURPOSE  Increase stool bulk.   Ease and regulate bowel movements.   Lower cholesterol.   REDUCE RISK OF COLON CANCER  INDICATIONS THAT YOU NEED MORE FIBER  Constipation and hemorrhoids.   Uncomplicated diverticulosis (intestine condition) and irritable bowel syndrome.   Weight management.   As a protective measure against hardening of the arteries (atherosclerosis), diabetes, and cancer.   GUIDELINES FOR INCREASING FIBER IN THE DIET  Start adding fiber to the diet slowly. A gradual increase of about 5 more grams (2 slices of whole-wheat bread, 2 servings of most fruits or vegetables, or 1 bowl of high-fiber cereal) per day is best. Too rapid an increase in fiber may result in constipation, flatulence, and bloating.   Drink enough water  and fluids to keep your urine clear or pale yellow. Water, juice, or caffeine-free drinks are recommended. Not drinking enough fluid may cause constipation.   Eat a variety of high-fiber foods rather than one type of fiber.   Try to increase your intake of fiber through using high-fiber foods rather than fiber pills or supplements that contain small amounts of fiber.   The goal is to change the types of food eaten. Do not supplement your present diet with high-fiber foods, but replace foods in your present diet.  INCLUDE A VARIETY OF FIBER SOURCES  Replace refined and processed grains with whole grains, canned fruits with fresh fruits, and incorporate other fiber sources. White rice, white breads, and most bakery goods contain little or no fiber.   Brown whole-grain rice, buckwheat oats, and many fruits and vegetables are all good sources of fiber. These include: broccoli, Brussels sprouts, cabbage, cauliflower, beets, sweet potatoes, white potatoes (skin on), carrots, tomatoes, eggplant, squash, berries, fresh fruits, and dried fruits.   Cereals appear to be the richest source of fiber. Cereal fiber is found in whole grains and bran. Bran is the fiber-rich outer coat of cereal grain, which is largely removed in refining. In whole-grain cereals, the bran remains. In breakfast cereals, the largest amount of fiber is found in those with "bran" in their names. The fiber content is sometimes indicated on the label.   You may need to include additional fruits and vegetables each day.   In baking, for 1 cup white flour, you may  use the following substitutions:   1 cup whole-wheat flour minus 2 tablespoons.   1/2 cup white flour plus 1/2 cup whole-wheat flour.

## 2015-11-29 NOTE — Progress Notes (Signed)
cc'ed to pcp °

## 2016-02-28 ENCOUNTER — Encounter: Payer: Self-pay | Admitting: Gastroenterology

## 2016-03-12 ENCOUNTER — Other Ambulatory Visit: Payer: Self-pay

## 2016-03-12 MED ORDER — LINACLOTIDE 72 MCG PO CAPS: 72.0000 ug | ORAL_CAPSULE | Freq: Every day | ORAL | 3 refills | Status: DC

## 2017-05-14 ENCOUNTER — Encounter (HOSPITAL_COMMUNITY): Payer: Self-pay | Admitting: *Deleted

## 2017-05-22 ENCOUNTER — Other Ambulatory Visit: Payer: Self-pay | Admitting: Obstetrics and Gynecology

## 2017-05-26 NOTE — H&P (Signed)
Cynthia Chase is a 37 y.o. female,  P: 2-0-1-2 with a history of endometriosis who  presents for laparoscopy  and removal of her right ovary because of  chronic pelvic pain.  For many years the patient has endured episodes of chronic pelvic pain and underwent a hysterectomy  with bilateral salpingectomy in 2016 for the same.  Following this surgery her relief was short-lived and she was evaluated and treated  by a Reproductive Endocrinologist in 2017 and placed on a combination letrazole and norethindrone with good pain management.  The patient continued this therapy until the summer of 2018 at which time she discontinued the treatement.  Three months later, the patient's pain symptoms returned and became progressively worse.  The right sided  sharp-shooting-stabbing pain was intermittent, most days of the week ranging in intensity from 2-8/10 on a 10 point pain scale.  Whenever she would bend, sit for an extended period of time or have intercourse,  her pain was more frequent and intense.  She denies any bowel or bladder function changes.  A pelvic ultrasound in February 2019 showed: a  surgically absent uterus/cervix; a right ovary: 4.19 cm with a mildly hemorrhagic appearing cyst measuring 3.6 cm with normal A-V waveforms and a left ovary: 2.15 cm.  Once again the patient was reminded of the medical and surgical options available for management of her pain..   The patient has chosen to proceed with surgical evaluation and management after  considering  her history of endometriosis and the recurrent and persistent nature of  her symptoms.   Past Medical History  OB History: G: 3;     P: 2-0-1- 2;  SVB  2009 and 2012  GYN History: menarche: 38 YO    LMP:  Hysterectomy    Denies history of abnormal PAP smear  or STDs.   Last PAP smear: 2015 (Atypical Cells of Undetermined Significance and Negative HPV)  Medical History: Endometriosis and Zoster  Surgical History: 2016  Laparoscopically Assisted Vaginal  Hysterectomy with Bilateral Salpingectomy;  2008  Dilatation and Evacuation (miscarriage);   2003 Left ACL Reconstruction; 2004 Right ACL Reconstruction and 1998 Right ACL Reconstruction;  1992  Adenoidectomy Denies problems with anesthesia (except post operative nausea and vomiting)  or history of blood transfusions  Family History: Hypertension, Breast Cancer, Diabetes, Lung Cancer, Pancreatic Cancer, Migraine, Basal Cell Carcinoma, Hyperlipidemia, Peripheral Artery Disease and Auto-immune Renal Disease  Social History: 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 Gentamicin   . Sulfa Antibiotics Rash    ROS: Admits to glasses, pelvic pain but  denies headache, vision changes, nasal congestion, dysphagia, tinnitus, dizziness, hoarseness, cough,  chest pain, shortness of breath, nausea, vomiting, diarrhea,constipation,  urinary frequency, urgency  dysuria, hematuria, vaginitis symptoms,  swelling of joints,easy bruising,  myalgias, arthralgias, skin rashes, unexplained weight loss and except as is mentioned in the history of present illness, patient's review of systems is otherwise negative.     Physical Exam  Bp:  92/64   P: 72  Temperature: 98 degree F orally    Weight: 157 lbs.   Height: 5' 6.6"    BMI: 25  Neck: supple without masses or thyromegaly Lungs: clear to auscultation Heart: regular rate and rhythm Abdomen: soft, diffusely tender  R> L with no guarding or rebound;  no organomegaly Pelvic:EGBUS- wnl; vagina-normal rugae; uterus/cervix-surgically absent;  adnexae-right sided tenderness with no palpable masses,  left adnexa without tenderness or masses Extremities:  no clubbing, cyanosis or edema   Assesment: Chronic Pelvic Pain                      Endometriosis                       Right Ovarian Cyst   Disposition:  A discussion was held with patient regarding the indication for her procedure(s) along with the risks, which include but are not limited to: reaction to anesthesia, damage to adjacent organs, infection and excessive bleeding.The patient verbalized understanding of these risks and has consented to proceed with a Laparoscopic Right Oophorect[facility] with [facility] of [facility] June 12, 2017 at 10:15 a.m.   CSN# [id number]   [place] J. Lowell GuitarPowell, PA-C  for Dr.  Maris BergerVanessa P. Haygood

## 2017-05-27 ENCOUNTER — Encounter (HOSPITAL_BASED_OUTPATIENT_CLINIC_OR_DEPARTMENT_OTHER): Payer: Self-pay | Admitting: Emergency Medicine

## 2017-05-27 ENCOUNTER — Other Ambulatory Visit: Payer: Self-pay

## 2017-05-27 NOTE — Progress Notes (Signed)
SPOKE WITH: patient RIDING HOME WITH: husband Justin   AM MEDICATIONS: probiotic chew, ibuprofen if needed NPO STATUS: npo after midnight , sips with meds  LABS: cbc; appt made for pre-op labs at pre-surgical testing for 06-05-17  At 0900 COMMENTS/CONCERNS: n/a ICE,;RN, BSN.

## 2017-06-05 ENCOUNTER — Encounter (HOSPITAL_COMMUNITY)
Admission: RE | Admit: 2017-06-05 | Discharge: 2017-06-05 | Disposition: A | Discharge status: Home / Self Care | Payer: BC Managed Care – PPO | Source: Ambulatory Visit | Attending: Obstetrics and Gynecology | Admitting: Obstetrics and Gynecology

## 2017-06-05 DIAGNOSIS — Z01812 Encounter for preprocedural laboratory examination: Secondary | ICD-10-CM | POA: Diagnosis present

## 2017-06-05 LAB — CBC
HCT: 38.7 % (ref 36.0–46.0)
Hemoglobin: 13.6 g/dL (ref 12.0–15.0)
MCH: 32.6 pg (ref 26.0–34.0)
MCHC: 35.1 g/dL (ref 30.0–36.0)
MCV: 92.8 fL (ref 78.0–100.0)
PLATELETS: 208 10*3/uL (ref 150–400)
RBC: 4.17 MIL/uL (ref 3.87–5.11)
RDW: 11.4 % — ABNORMAL LOW (ref 11.5–15.5)
WBC: 6 10*3/uL (ref 4.0–10.5)

## 2017-06-12 ENCOUNTER — Encounter (HOSPITAL_BASED_OUTPATIENT_CLINIC_OR_DEPARTMENT_OTHER)
Admission: RE | Disposition: A | Discharge status: Home / Self Care | Payer: Self-pay | Source: Ambulatory Visit | Attending: Obstetrics and Gynecology

## 2017-06-12 ENCOUNTER — Ambulatory Visit (HOSPITAL_BASED_OUTPATIENT_CLINIC_OR_DEPARTMENT_OTHER): Payer: BC Managed Care – PPO | Admitting: Anesthesiology

## 2017-06-12 ENCOUNTER — Ambulatory Visit (HOSPITAL_COMMUNITY)
Admission: RE | Admit: 2017-06-12 | Discharge: 2017-06-12 | Disposition: A | Discharge status: Home / Self Care | Payer: BC Managed Care – PPO | Source: Ambulatory Visit | Attending: Obstetrics and Gynecology | Admitting: Obstetrics and Gynecology

## 2017-06-12 ENCOUNTER — Encounter (HOSPITAL_BASED_OUTPATIENT_CLINIC_OR_DEPARTMENT_OTHER): Payer: Self-pay | Admitting: *Deleted

## 2017-06-12 DIAGNOSIS — N736 Female pelvic peritoneal adhesions (postinfective): Secondary | ICD-10-CM | POA: Diagnosis present

## 2017-06-12 DIAGNOSIS — R102 Pelvic and perineal pain: Secondary | ICD-10-CM | POA: Insufficient documentation

## 2017-06-12 DIAGNOSIS — Z882 Allergy status to sulfonamides status: Secondary | ICD-10-CM | POA: Diagnosis not present

## 2017-06-12 DIAGNOSIS — N80109 Endometriosis of ovary, unspecified side, unspecified depth: Secondary | ICD-10-CM | POA: Diagnosis present

## 2017-06-12 DIAGNOSIS — G8929 Other chronic pain: Secondary | ICD-10-CM | POA: Insufficient documentation

## 2017-06-12 DIAGNOSIS — N8301 Follicular cyst of right ovary: Secondary | ICD-10-CM | POA: Insufficient documentation

## 2017-06-12 DIAGNOSIS — Z79899 Other long term (current) drug therapy: Secondary | ICD-10-CM | POA: Insufficient documentation

## 2017-06-12 DIAGNOSIS — N801 Endometriosis of ovary: Secondary | ICD-10-CM | POA: Insufficient documentation

## 2017-06-12 DIAGNOSIS — R1031 Right lower quadrant pain: Secondary | ICD-10-CM | POA: Insufficient documentation

## 2017-06-12 DIAGNOSIS — Z888 Allergy status to other drugs, medicaments and biological substances status: Secondary | ICD-10-CM | POA: Diagnosis not present

## 2017-06-12 DIAGNOSIS — Z8742 Personal history of other diseases of the female genital tract: Secondary | ICD-10-CM

## 2017-06-12 HISTORY — DX: Unspecified ovarian cyst, right side: N83.201

## 2017-06-12 HISTORY — DX: Personal history of other diseases of the digestive system: Z87.19

## 2017-06-12 HISTORY — DX: Presence of spectacles and contact lenses: Z97.3

## 2017-06-12 SURGERY — OOPHORECTOMY, LAPAROSCOPIC
Anesthesia: General | Site: Abdomen | Laterality: Right

## 2017-06-12 MED ORDER — FENTANYL CITRATE (PF) 250 MCG/5ML IJ SOLN
INTRAMUSCULAR | Status: AC
  Filled 2017-06-12: qty 5

## 2017-06-12 MED ORDER — MEPERIDINE HCL 25 MG/ML IJ SOLN
6.2500 mg | INTRAMUSCULAR | Status: DC | PRN
  Filled 2017-06-12: qty 1

## 2017-06-12 MED ORDER — ACETAMINOPHEN 325 MG PO TABS
ORAL_TABLET | ORAL | Status: AC
  Filled 2017-06-12: qty 1

## 2017-06-12 MED ORDER — KETOROLAC TROMETHAMINE 15 MG/ML IJ SOLN
15.0000 mg | INTRAMUSCULAR | Status: DC
  Filled 2017-06-12: qty 1

## 2017-06-12 MED ORDER — MIDAZOLAM HCL 2 MG/2ML IJ SOLN
INTRAMUSCULAR | Status: DC | PRN
  Administered 2017-06-12: 1 mg via INTRAVENOUS

## 2017-06-12 MED ORDER — PROPOFOL 10 MG/ML IV BOLUS
INTRAVENOUS | Status: AC
  Filled 2017-06-12: qty 40

## 2017-06-12 MED ORDER — OXYCODONE HCL 5 MG PO TABS
ORAL_TABLET | ORAL | Status: AC
  Filled 2017-06-12: qty 1

## 2017-06-12 MED ORDER — MIDAZOLAM HCL 2 MG/2ML IJ SOLN
INTRAMUSCULAR | Status: AC
  Filled 2017-06-12: qty 2

## 2017-06-12 MED ORDER — ACETAMINOPHEN 160 MG/5ML PO SOLN
1000.0000 mg | Freq: Four times a day (QID) | ORAL | Status: DC | PRN
  Administered 2017-06-12: 1000 mg via ORAL
  Filled 2017-06-12: qty 40.6

## 2017-06-12 MED ORDER — KETOROLAC TROMETHAMINE 30 MG/ML IJ SOLN
INTRAMUSCULAR | Status: DC | PRN
  Administered 2017-06-12: 15 mg via INTRAVENOUS
  Administered 2017-06-12: 15 mg via INTRAMUSCULAR

## 2017-06-12 MED ORDER — ONDANSETRON HCL 4 MG/2ML IJ SOLN
INTRAMUSCULAR | Status: AC
  Filled 2017-06-12: qty 2

## 2017-06-12 MED ORDER — DEXAMETHASONE SODIUM PHOSPHATE 10 MG/ML IJ SOLN
INTRAMUSCULAR | Status: AC
  Filled 2017-06-12: qty 1

## 2017-06-12 MED ORDER — DEXAMETHASONE SODIUM PHOSPHATE 10 MG/ML IJ SOLN
INTRAMUSCULAR | Status: DC | PRN
  Administered 2017-06-12: 10 mg via INTRAVENOUS

## 2017-06-12 MED ORDER — SUGAMMADEX SODIUM 200 MG/2ML IV SOLN
INTRAVENOUS | Status: AC
  Filled 2017-06-12: qty 2

## 2017-06-12 MED ORDER — SUGAMMADEX SODIUM 200 MG/2ML IV SOLN
INTRAVENOUS | Status: DC | PRN
  Administered 2017-06-12: 140 mg via INTRAVENOUS

## 2017-06-12 MED ORDER — ACETAMINOPHEN 500 MG PO TABS
1000.0000 mg | ORAL_TABLET | ORAL | Status: DC
  Filled 2017-06-12: qty 2

## 2017-06-12 MED ORDER — FENTANYL CITRATE (PF) 250 MCG/5ML IJ SOLN
INTRAMUSCULAR | Status: DC | PRN
  Administered 2017-06-12 (×3): 50 ug via INTRAVENOUS

## 2017-06-12 MED ORDER — IBUPROFEN 600 MG PO TABS: ORAL_TABLET | ORAL | 1 refills | Status: DC

## 2017-06-12 MED ORDER — KETOROLAC TROMETHAMINE 30 MG/ML IJ SOLN
INTRAMUSCULAR | Status: AC
  Filled 2017-06-12: qty 1

## 2017-06-12 MED ORDER — HYDROMORPHONE HCL 1 MG/ML IJ SOLN
INTRAMUSCULAR | Status: AC
Start: 2017-06-12 — End: 2017-06-12
  Filled 2017-06-12: qty 1

## 2017-06-12 MED ORDER — BUPIVACAINE HCL (PF) 0.25 % IJ SOLN
INTRAMUSCULAR | Status: DC | PRN
  Administered 2017-06-12: 5 mL

## 2017-06-12 MED ORDER — SCOPOLAMINE 1 MG/3DAYS TD PT72
MEDICATED_PATCH | TRANSDERMAL | Status: AC
  Filled 2017-06-12: qty 1

## 2017-06-12 MED ORDER — PROPOFOL 10 MG/ML IV BOLUS
INTRAVENOUS | Status: DC | PRN
  Administered 2017-06-12: 20 mg via INTRAVENOUS
  Administered 2017-06-12: 160 mg via INTRAVENOUS
  Administered 2017-06-12: 20 mg via INTRAVENOUS

## 2017-06-12 MED ORDER — LIDOCAINE 2% (20 MG/ML) 5 ML SYRINGE
INTRAMUSCULAR | Status: DC | PRN
  Administered 2017-06-12: 80 mg via INTRAVENOUS

## 2017-06-12 MED ORDER — ONDANSETRON HCL 4 MG/2ML IJ SOLN
INTRAMUSCULAR | Status: DC | PRN
  Administered 2017-06-12: 4 mg via INTRAVENOUS

## 2017-06-12 MED ORDER — LIDOCAINE 2% (20 MG/ML) 5 ML SYRINGE
INTRAMUSCULAR | Status: AC
  Filled 2017-06-12: qty 5

## 2017-06-12 MED ORDER — LACTATED RINGERS IV SOLN
INTRAVENOUS | Status: DC
  Administered 2017-06-12 (×4): via INTRAVENOUS
  Filled 2017-06-12: qty 1000

## 2017-06-12 MED ORDER — HYDROMORPHONE HCL 1 MG/ML IJ SOLN
INTRAMUSCULAR | Status: AC
  Filled 2017-06-12: qty 1

## 2017-06-12 MED ORDER — CELECOXIB 400 MG PO CAPS
400.0000 mg | ORAL_CAPSULE | ORAL | Status: AC
  Administered 2017-06-12: 200 mg via ORAL
  Filled 2017-06-12: qty 1

## 2017-06-12 MED ORDER — CELECOXIB 200 MG PO CAPS
ORAL_CAPSULE | ORAL | Status: AC
  Filled 2017-06-12: qty 1

## 2017-06-12 MED ORDER — ONDANSETRON HCL 4 MG/2ML IJ SOLN
4.0000 mg | Freq: Once | INTRAMUSCULAR | Status: AC | PRN
  Administered 2017-06-12: 4 mg via INTRAVENOUS
  Filled 2017-06-12: qty 2

## 2017-06-12 MED ORDER — OXYCODONE HCL 5 MG PO TABS
5.0000 mg | ORAL_TABLET | Freq: Once | ORAL | Status: AC
  Administered 2017-06-12: 5 mg via ORAL
  Filled 2017-06-12: qty 1

## 2017-06-12 MED ORDER — ROCURONIUM BROMIDE 10 MG/ML (PF) SYRINGE
PREFILLED_SYRINGE | INTRAVENOUS | Status: DC | PRN
  Administered 2017-06-12: 10 mg via INTRAVENOUS
  Administered 2017-06-12: 50 mg via INTRAVENOUS

## 2017-06-12 MED ORDER — ROCURONIUM BROMIDE 10 MG/ML (PF) SYRINGE
PREFILLED_SYRINGE | INTRAVENOUS | Status: AC
  Filled 2017-06-12: qty 5

## 2017-06-12 MED ORDER — OXYCODONE-ACETAMINOPHEN 5-325 MG PO TABS: ORAL_TABLET | ORAL | 0 refills | Status: DC

## 2017-06-12 MED ORDER — HYDROMORPHONE HCL 1 MG/ML IJ SOLN
0.2500 mg | INTRAMUSCULAR | Status: DC | PRN
  Administered 2017-06-12 (×3): 0.5 mg via INTRAVENOUS
  Filled 2017-06-12: qty 0.5

## 2017-06-12 MED ORDER — ACETAMINOPHEN 160 MG/5ML PO SOLN
ORAL | Status: AC
  Filled 2017-06-12: qty 20.3

## 2017-06-12 MED ORDER — SCOPOLAMINE 1 MG/3DAYS TD PT72
1.0000 | MEDICATED_PATCH | TRANSDERMAL | Status: AC
  Administered 2017-06-12: 1 via TRANSDERMAL
  Filled 2017-06-12: qty 1

## 2017-06-12 SURGICAL SUPPLY — 68 items
BAG RETRIEVAL 10MM (BASKET) ×1
BAG URINE DRAINAGE (UROLOGICAL SUPPLIES) IMPLANT
BALLN HASSAN TROCAR (MISCELLANEOUS) IMPLANT
CABLE HIGH FREQUENCY MONO STRZ (ELECTRODE) ×3 IMPLANT
CATH ROBINSON RED A/P 16FR (CATHETERS) IMPLANT
CLOSURE WOUND 1/4 X3 (GAUZE/BANDAGES/DRESSINGS)
DERMABOND ADVANCED (GAUZE/BANDAGES/DRESSINGS) ×2
DERMABOND ADVANCED .7 DNX12 (GAUZE/BANDAGES/DRESSINGS) ×1 IMPLANT
DRSG OPSITE POSTOP 3X4 (GAUZE/BANDAGES/DRESSINGS) ×3 IMPLANT
DRSG TEGADERM 2-3/8X2-3/4 SM (GAUZE/BANDAGES/DRESSINGS) ×2 IMPLANT
DRSG TELFA 3X8 NADH (GAUZE/BANDAGES/DRESSINGS) IMPLANT
DURAPREP 26ML APPLICATOR (WOUND CARE) ×3 IMPLANT
GAUZE SPONGE 4X4 16PLY XRAY LF (GAUZE/BANDAGES/DRESSINGS) ×2 IMPLANT
GLOVE BIO SURGEON STRL SZ7.5 (GLOVE) ×4 IMPLANT
GLOVE BIOGEL PI IND STRL 6.5 (GLOVE) IMPLANT
GLOVE BIOGEL PI IND STRL 7.0 (GLOVE) ×3 IMPLANT
GLOVE BIOGEL PI IND STRL 7.5 (GLOVE) IMPLANT
GLOVE BIOGEL PI INDICATOR 6.5 (GLOVE) ×2
GLOVE BIOGEL PI INDICATOR 7.0 (GLOVE) ×4
GLOVE BIOGEL PI INDICATOR 7.5 (GLOVE) ×6
GLOVE SURG SS PI 6.5 STRL IVOR (GLOVE) ×6 IMPLANT
GOWN STRL REUS W/TWL LRG LVL3 (GOWN DISPOSABLE) ×9 IMPLANT
KIT TURNOVER CYSTO (KITS) ×3 IMPLANT
NDL HYPO 25X1 1.5 SAFETY (NEEDLE) ×1 IMPLANT
NDL INSUFFLATION 14GA 120MM (NEEDLE) IMPLANT
NDL INSUFFLATION 14GA 150MM (NEEDLE) IMPLANT
NEEDLE HYPO 25X1 1.5 SAFETY (NEEDLE) ×3 IMPLANT
NEEDLE INSUFFLATION 14GA 120MM (NEEDLE) IMPLANT
NEEDLE INSUFFLATION 14GA 150MM (NEEDLE) IMPLANT
NS IRRIG 1000ML POUR BTL (IV SOLUTION) ×1 IMPLANT
NS IRRIG 500ML POUR BTL (IV SOLUTION) ×4 IMPLANT
PACK LAPAROSCOPY BASIN (CUSTOM PROCEDURE TRAY) ×3 IMPLANT
PACK TRENDGUARD 450 HYBRID PRO (MISCELLANEOUS) IMPLANT
PAD DRESSING TELFA 3X8 NADH (GAUZE/BANDAGES/DRESSINGS) IMPLANT
PAD POSITIONING PINK XL (MISCELLANEOUS) ×3 IMPLANT
PAD PREP 24X48 CUFFED NSTRL (MISCELLANEOUS) ×3 IMPLANT
POUCH SPECIMEN RETRIEVAL 10MM (ENDOMECHANICALS) IMPLANT
PROTECTOR NERVE ULNAR (MISCELLANEOUS) ×3 IMPLANT
SET IRRIG TUBING LAPAROSCOPIC (IRRIGATION / IRRIGATOR) IMPLANT
SET TRI-LUMEN FLTR TB AIRSEAL (TUBING) ×2 IMPLANT
SHEARS HARMONIC ACE PLUS 36CM (ENDOMECHANICALS) IMPLANT
SHEARS HARMONIC ACE PLUS 45CM (MISCELLANEOUS) IMPLANT
SHEET LAVH (DRAPES) ×2 IMPLANT
SOLUTION ANTI FOG 6CC (MISCELLANEOUS) ×2 IMPLANT
SPONGE GAUZE 2X2 8PLY STER LF (GAUZE/BANDAGES/DRESSINGS) ×1
SPONGE GAUZE 2X2 8PLY STRL LF (GAUZE/BANDAGES/DRESSINGS) ×1 IMPLANT
STRIP CLOSURE SKIN 1/4X3 (GAUZE/BANDAGES/DRESSINGS) IMPLANT
SUT MNCRL AB 4-0 PS2 18 (SUTURE) ×2 IMPLANT
SUT VIC AB 0 UR5 27 (SUTURE) ×2 IMPLANT
SUT VIC AB 2-0 UR5 27 (SUTURE) ×2 IMPLANT
SUT VIC AB 3-0 PS2 18 (SUTURE)
SUT VIC AB 3-0 PS2 18XBRD (SUTURE) IMPLANT
SUT VICRYL 0 ENDOLOOP (SUTURE) ×4 IMPLANT
SUT VICRYL 0 UR6 27IN ABS (SUTURE) ×9 IMPLANT
SYR 50ML LL SCALE MARK (SYRINGE) ×3 IMPLANT
SYRINGE 10CC LL (SYRINGE) ×3 IMPLANT
SYS BAG RETRIEVAL 10MM (BASKET) ×2
SYSTEM BAG RETRIEVAL 10MM (BASKET) IMPLANT
TOWEL OR 17X24 6PK STRL BLUE (TOWEL DISPOSABLE) ×6 IMPLANT
TRAY FOLEY CATH SILVER 14FR (SET/KITS/TRAYS/PACK) ×3 IMPLANT
TRENDGUARD 450 HYBRID PRO PACK (MISCELLANEOUS) ×3
TROCAR BALLN 12MMX100 BLUNT (TROCAR) ×2 IMPLANT
TROCAR OPTI TIP 5M 100M (ENDOMECHANICALS) ×5 IMPLANT
TROCAR PORT AIRSEAL 5X120 (TROCAR) ×2 IMPLANT
TROCAR XCEL DIL TIP R 11M (ENDOMECHANICALS) ×2 IMPLANT
TUBING INSUF HEATED (TUBING) ×1 IMPLANT
WARMER LAPAROSCOPE (MISCELLANEOUS) ×3 IMPLANT
WATER STERILE IRR 500ML POUR (IV SOLUTION) IMPLANT

## 2017-06-12 NOTE — Progress Notes (Signed)
Surgeon gave a verbal order to give only 200 celebrex

## 2017-06-12 NOTE — Op Note (Addendum)
Operative  Laparoscopy with right salpingo-oophorectomy Procedure Note  Indications: The patient is a 37 y.o. female with known endometriosis and persistent right adnexal and pelvic pain over the last year that responded to medical ovarian ablation with letrozole, then recurred upon discontinuation of letrozole.  Patient is status post laparroscopically assisted vaginal hysterectomy..  Pre-operative Diagnosis: Pelvic pain specifically right lower quadrant right adnexal pain.  History of endometriosis.  Status post LAVH  Post-operative Diagnosis: Right adnexal pain.  Right ovarian endometriosis.  Appendiceal adhesions to the right ovary.  Surgeon: Hal Morales   Assistants:Elmira Lowell Guitar certified physician assistant  Anesthesia: General endotracheal anesthesia  ASA Class: 2  Procedure Details  The patient was seen in the Holding Room. The risks, benefits, complications, treatment options, and expected outcomes were discussed with the patient. The possibilities of reaction to medication, pulmonary aspiration, perforation of viscus, bleeding, recurrent infection, the need for additional procedures, failure to diagnose a condition, and creating a complication requiring transfusion or operation were discussed with the patient. The patient concurred with the proposed plan, giving informed consent. The patient was taken to the Operating Room, identified as Cynthia Chase and the procedure verified as operative laparoscopy with right salpingo-oophorectomy and possible lysis of adhesions. A Time Out was held and the above information confirmed.  After induction of general anesthesia, the patient was placed in modified dorsal lithotomy position where she was prepped, draped, and catheterized in the normal, sterile fashion.  Sponge stick containing 4 Ray-Tec sponges was placed in the vagina to use as a vaginal cuff manipulator.  Umbilical and suprapubic injections of quarter percent Marcaine were  undertaken.  A subumbilical incision was made carried down to the fascia.  The fascia was elevated and incised.  The peritoneum was bluntly entered.  Holding sutures of 0 Vicryl placed on the fascia and peritoneum and the cannula placed into the peritoneal cavity under direct visualization.  Suprapubic incisions were made to the right and left of midline with a 5 mm air seal  placed in the left side and a 10 mm trocar placed on the right side.  The below noted findings were made and documented.  The tip of the appendix was adherent to the right pelvic sidewall and right ovary.  The appendiceal epiploica was elevated and the adhesions sharply incised allowing the appendix to released into its normal anatomic position.  The right ovary was elevated and the remainder of the mesosalpinx attachments were cauterized and cut with hemostasis made adequate with bipolar cautery.  The right ureter was identified.  The ovary was elevated and its pedicle attachment was doubly tied with Endoloops of 0 Vicryl.  The ovary and tube were excised and placed in an Endo Catch bag for removal through the right lower quadrant trocar insertion.  Bleeding from that trocar insertion was managed with deep sutures of 0 Vicryl allowing for adequate hemostasis in the right lower quadrant incision.  Irrigation was carried out.  All instruments were then removed from the peritoneal cavity under direct visualization as the CO2 was allowed to escape.  Subumbilical incision was closed with figure-of-eight sutures of 0 Vicryl using the held sutures from the time of peritoneal entry a subcutaneous suture of 0 Vicryl was placed.  The cuticular sutures of 3-0 Monocryl were placed.  Dermabond was placed over that incision.  The left lower quadrant trocar incision was closed with deep sutures to allow for adequate hemostasis and then the skin incision closed with subcuticular sutures of 3-0  Monocryl.  The right lower quadrant incision had been closed with  the hemostatic sutures that were full-thickness and the skin reapproximated with Dermabond.  Sterile dressings were applied to all 3 incisions.  The sponge stick was removed from the vagina and the Foley catheter removed.  The patient was awakened from general anesthesia and taken to the recovery room in satisfactory condition having tolerated the procedure well with sponge and instrument counts correct.   Findings: The anterior cul-de-sac and round ligaments no significant adhesions or evidence of endometriosis The uterus surgically absent The adnexa the left ovary was completely within normal limits.  There were minimal adhesions in the overlying bowel hair however the left ovary was freely mobile without any evidence of significant adhesions or endometriosis.  The right ovary contains stigmata of endometriosis.  The appendiceal epiploica was adherent to the ovary with thin filmy adhesions. Cul-de-sac there were no significant adhesions or evidence of endometriosis.  Estimated Blood Loss:  less than 50 mL         Drains: None         Specimens: Right ovary and fallopian tube              Complications:  None; patient tolerated the procedure well.         Disposition: PACU - hemodynamically stable.         Condition: stable

## 2017-06-12 NOTE — Anesthesia Preprocedure Evaluation (Signed)
Anesthesia Evaluation  Patient identified by MRN, date of birth, ID band Patient awake    Reviewed: Allergy & Precautions, NPO status , Patient's Chart, lab work & pertinent test results  History of Anesthesia Complications (+) PONV  Airway Mallampati: I  TM Distance: >3 FB Neck ROM: Full    Dental   Pulmonary    Pulmonary exam normal        Cardiovascular Normal cardiovascular exam     Neuro/Psych    GI/Hepatic   Endo/Other    Renal/GU      Musculoskeletal   Abdominal   Peds  Hematology   Anesthesia Other Findings   Reproductive/Obstetrics                             Anesthesia Physical Anesthesia Plan  ASA: II  Anesthesia Plan: General   Post-op Pain Management:    Induction: Intravenous  PONV Risk Score and Plan: 4 or greater and Scopolamine patch - Pre-op, Midazolam, Ondansetron and Dexamethasone  Airway Management Planned: Oral ETT  Additional Equipment:   Intra-op Plan:   Post-operative Plan: Extubation in OR  Informed Consent: I have reviewed the patients History and Physical, chart, labs and discussed the procedure including the risks, benefits and alternatives for the proposed anesthesia with the patient or authorized representative who has indicated his/her understanding and acceptance.     Plan Discussed with: CRNA and Surgeon  Anesthesia Plan Comments:         Anesthesia Quick Evaluation

## 2017-06-12 NOTE — Anesthesia Postprocedure Evaluation (Signed)
Anesthesia Post Note  Patient: Audrea MuscatKatherine R Langenfeld  Procedure(s) Performed: LAPAROSCOPIC SALPING-OOPHORECTOMY, LYSIS OF ADHESIONS (Right Abdomen)     Patient location during evaluation: PACU Anesthesia Type: General Level of consciousness: awake and alert Pain management: pain level controlled Vital Signs Assessment: post-procedure vital signs reviewed and stable Respiratory status: spontaneous breathing, nonlabored ventilation, respiratory function stable and patient connected to nasal cannula oxygen Cardiovascular status: blood pressure returned to baseline and stable Postop Assessment: no apparent nausea or vomiting Anesthetic complications: no    Last Vitals:  Vitals Value Taken Time  BP    Temp    Pulse    Resp    SpO2      Last Pain:  Vitals:   06/12/17 1213  TempSrc:   PainSc: 3                  Joley Utecht DAVID

## 2017-06-12 NOTE — Discharge Instructions (Addendum)
Call [facility] @ [phone] if:  You have a temperature greater than or equal to 100.4 degrees Farenheit orally You have pain that is not made better by the pain medication given and taken as directed You have excessive bleeding or problems urinating  Take Colace (Docusate Sodium/Stool Softener) 100 mg 2-3 times daily while taking narcotic pain medicine to avoid constipation or until bowel movements are regular. Take Ibuprofen 600 mg with food,  every 6 hours for 5 days then as needed for pain;  FIRST DOSE, (day of surgery) should be 4:30 p.m. DO NOT TAKE MELOXICAM WITH IBUPROFEN  You may drive after 24 hours as long as you are not taking percocet within 24 hrs You may walk up steps  You may shower tomorrow You may resume a regular diet  Keep incisions clean and dry and remove honeycomb dressing on June 19, 2017 Do not lift over 15 pounds for 6 weeks Avoid anything in vagina until after your post-operative visit  Post Anesthesia Home Care Instructions  Activity: Get plenty of rest for the remainder of the day. A responsible individual must stay with you for 24 hours following the procedure.  For the next 24 hours, DO NOT: -Drive a car -Advertising copywriterperate machinery -Drink alcoholic beverages -Take any medication unless instructed by your physician -Make any legal decisions or sign important papers.  Meals: Start with liquid foods such as gelatin or soup. Progress to regular foods as tolerated. Avoid greasy, spicy, heavy foods. If nausea and/or vomiting occur, drink only clear liquids until the nausea and/or vomiting subsides. Call your physician if vomiting continues.  Special Instructions/Symptoms: Your throat may feel dry or sore from the anesthesia or the breathing tube placed in your throat during surgery. If this causes discomfort, gargle with warm salt water. The discomfort should disappear within 24 hours.  If you had a scopolamine patch placed behind your ear for the  management of post- operative nausea and/or vomiting:  1. The medication in the patch is effective for 72 hours, after which it should be removed.  Wrap patch in a tissue and discard in the trash. Wash hands thoroughly with soap and water. 2. You may remove the patch earlier than 72 hours if you experience unpleasant side effects which may include dry mouth, dizziness or visual disturbances. 3. Avoid touching the patch. Wash your hands with soap and water after contact with the patch.

## 2017-06-12 NOTE — Anesthesia Procedure Notes (Signed)
Procedure Name: Intubation Date/Time: 06/12/2017 7:41 AM Performed by: Georgeanne Nim, CRNA Pre-anesthesia Checklist: Patient identified, Emergency Drugs available, Suction available, Patient being monitored and Timeout performed Patient Re-evaluated:Patient Re-evaluated prior to induction Oxygen Delivery Method: Circle system utilized Preoxygenation: Pre-oxygenation with 100% oxygen Induction Type: IV induction Ventilation: Mask ventilation without difficulty Laryngoscope Size: Mac and 3 Grade View: Grade I Number of attempts: 1 Airway Equipment and Method: Stylet Placement Confirmation: ETT inserted through vocal cords under direct vision,  positive ETCO2,  CO2 detector and breath sounds checked- equal and bilateral Secured at: 21 cm Tube secured with: Tape Dental Injury: Teeth and Oropharynx as per pre-operative assessment

## 2017-06-12 NOTE — Transfer of Care (Signed)
Immediate Anesthesia Transfer of Care Note  Patient: Cynthia MuscatKatherine R Spurr  Procedure(s) Performed: LAPAROSCOPIC SALPING-OOPHORECTOMY, LYSIS OF ADHESIONS (Right Abdomen)  Patient Location: PACU  Anesthesia Type:General  Level of Consciousness: awake and patient cooperative  Airway & Oxygen Therapy: Patient Spontanous Breathing and Patient connected to nasal cannula oxygen  Post-op Assessment: Report given to RN and Post -op Vital signs reviewed and stable  Post vital signs: Reviewed and stable  Last Vitals:  Vitals Value Taken Time  BP 106/64 06/12/2017 10:35 AM  Temp 36.7 C 06/12/2017 10:35 AM  Pulse 84 06/12/2017 10:37 AM  Resp 12 06/12/2017 10:37 AM  SpO2 100 % 06/12/2017 10:37 AM  Vitals shown include unvalidated device data.  Last Pain:  Vitals:   06/12/17 0546  TempSrc: Oral         Complications: No apparent anesthesia complications

## 2017-06-12 NOTE — H&P (Signed)
History and Physical Interval Note:   06/12/2017   7:23 AM   Audrea MuscatKatherine R Nanda  has presented today for surgery, with the diagnosis of pelvic pain, history of right ovarian cyst, endometriosis s/p vaginal hysterectomy.  The various methods of treatment have been discussed with the patient and family. After consideration of risks, benefits and other options for treatment, the patient has consented to  Procedure(s): LAPAROSCOPIC RIGHT OOPHORECTOMY as a surgical intervention .  I have reviewed the patients' chart and labs.  Questions were answered to the patient's satisfaction.     Hal MoralesVanessa P Alim Cattell  MD

## 2018-06-22 ENCOUNTER — Encounter: Payer: BC Managed Care – PPO | Admitting: Surgery

## 2018-06-22 ENCOUNTER — Other Ambulatory Visit (HOSPITAL_COMMUNITY): Payer: BC Managed Care – PPO

## 2018-09-03 ENCOUNTER — Other Ambulatory Visit: Payer: Self-pay | Admitting: Physician Assistant

## 2018-09-03 ENCOUNTER — Other Ambulatory Visit: Payer: Self-pay

## 2018-09-03 DIAGNOSIS — I7300 Raynaud's syndrome without gangrene: Secondary | ICD-10-CM

## 2018-09-07 ENCOUNTER — Ambulatory Visit: Payer: BC Managed Care – PPO | Admitting: Surgery

## 2018-09-07 ENCOUNTER — Other Ambulatory Visit: Payer: Self-pay

## 2018-09-07 ENCOUNTER — Ambulatory Visit (HOSPITAL_COMMUNITY)
Admission: RE | Admit: 2018-09-07 | Discharge: 2018-09-07 | Disposition: A | Discharge status: Home / Self Care | Payer: BC Managed Care – PPO | Source: Ambulatory Visit | Attending: Family | Admitting: Family

## 2018-09-07 ENCOUNTER — Encounter: Payer: Self-pay | Admitting: Surgery

## 2018-09-07 ENCOUNTER — Other Ambulatory Visit: Payer: Self-pay | Admitting: Surgery

## 2018-09-07 DIAGNOSIS — I7300 Raynaud's syndrome without gangrene: Secondary | ICD-10-CM | POA: Insufficient documentation

## 2018-09-07 NOTE — Progress Notes (Signed)
[facility]  Patient name: Cynthia Chase MRN: [id number] DOB: 1980/12/13 Sex: female   REQUESTING PROVIDER:    Dr. Neita CarpSasser   REASON FOR CONSULT:    Raynauds  HISTORY OF PRESENT ILLNESS:   Cynthia Chase is a 38 y.o. female, who is referred for further evaluation of rainouts phenomenon.  The patient says she began having symptoms back in October while her son was playing baseball.  She was noticed that when taking her fingers out of her gloves that they would turn white and get numb.  This also began affecting her toes.  She has been evaluated by rheumatology and all tests have come back unremarkable.  She is recently started amlodipine which has helped her symptoms.  She does try to avoid extremes in temperature.  She is a non-smoker.  PAST MEDICAL HISTORY    Past Medical History:  Diagnosis Date  . Abnormal Pap smear    2006  . Anemia    With pregnancy  . Arthritis    Osteoarthritis; bilateral knees  . Headache    Migraines  . History of anal fissures    saw Dr Jonette EvaSandi [facility] gastro for eval or rectal bleeding and lower abdominal pain, diagnostic colonoscopy 2017;  denies current abdomnal symptoms   . PONV (postoperative nausea and vomiting)    does well with scopolamine   . Right ovarian cyst   . Shingles    3rd and 4th grade; denies flare-ups   . Varicella    as a child  . Wears glasses      FAMILY HISTORY   Family History  Problem Relation Age of Onset  . Hyperlipidemia Mother   . Cancer Mother   . Cancer Maternal Grandfather   . Colon polyps Father   . Colon cancer Neg Hx   . Ulcerative colitis Neg Hx   . Crohn's disease Neg Hx     SOCIAL HISTORY:   Social History   Socioeconomic History  . Marital status: Married    Spouse name: Not on file  . Number of children: Not on file  . Years of education: Not on file  . Highest education level: Not on file  Occupational History  . Not  on file  Social Needs  . Financial resource strain: Not on file  . Food insecurity    Worry: Not on file    Inability: Not on file  . Transportation needs    Medical: Not on file    Non-medical: Not on file  Tobacco Use  . Smoking status: Never Smoker  . Smokeless tobacco: Never Used  . Tobacco comment: Never smoked  Substance and Sexual Activity  . Alcohol use: Yes    Alcohol/week: 0.0 standard drinks    Comment: occassional  . Drug use: No  . Sexual activity: Yes    Birth control/protection: Post-menopausal  Lifestyle  . Physical activity    Days per week: Not on file    Minutes per session: Not on file  . Stress: Not on file  Relationships  . Social Musicianconnections    Talks on phone: Not on file    Gets together: Not on file    Attends religious service: Not on file    Active member of club or organization: Not on file    Attends meetings of clubs or organizations: Not on file    Relationship status: Not on file  . Intimate partner violence    Fear of current  or ex partner: Not on file    Emotionally abused: Not on file    Physically abused: Not on file    Forced sexual activity: Not on file  Other Topics Concern  . Not on file  Social History Narrative   SELLS INSURANCE FOR A LIVING. 2 KIDS(4,7)-MARRIED. BORN AND RAISED IN [place]. WENT TO [place] AND PLAYED BASKETBALL.     ALLERGIES:    Allergies  Allergen Reactions  . Gentamicin     Eye drop form, caused itchy red eyes   . Adhesive [Tape]     Band aids; casue redness   . Sulfa Antibiotics Rash    CURRENT MEDICATIONS:    Current Outpatient Medications  Medication Sig Dispense Refill  . amLODipine (NORVASC) 5 MG tablet TAKE 1 TABLET BY MOUTH ONCE DAILY FOR RAYNAUDS    . Lactobacillus (PROBIOTIC CHILDRENS) CHEW Chew by mouth daily.    . meloxicam (MOBIC) 15 MG tablet 15 mg daily.      No current facility-administered medications for this visit.     REVIEW OF SYSTEMS:   [X]  denotes positive  finding, [ ]  denotes negative finding Cardiac  Comments:  Chest pain or chest pressure:    Shortness of breath upon exertion:    Short of breath when lying flat:    Irregular heart rhythm:        Vascular    Pain in calf, thigh, or hip brought on by ambulation:    Pain in feet at night that wakes you up from your sleep:     Blood clot in your veins:    Leg swelling:         Pulmonary    Oxygen at home:    Productive cough:     Wheezing:         Neurologic    Sudden weakness in arms or legs:     Sudden numbness in arms or legs:  x   Sudden onset of difficulty speaking or slurred speech:    Temporary loss of vision in one eye:     Problems with dizziness:         Gastrointestinal    Blood in stool:      Vomited blood:         Genitourinary    Burning when urinating:     Blood in urine:        Psychiatric    Major depression:         Hematologic    Bleeding problems:    Problems with blood clotting too easily:        Skin    Rashes or ulcers:        Constitutional    Fever or chills:     PHYSICAL EXAM:   There were no vitals filed for this visit.  GENERAL: The patient is a well-nourished female, in no acute distress. The vital signs are documented above. CARDIAC: There is a regular rate and rhythm.  VASCULAR: Palpable radial pulse bilaterally.  Palpable dorsalis pedis pulse bilaterally PULMONARY: Nonlabored respirations.  MUSCULOSKELETAL: There are no major deformities or cyanosis. NEUROLOGIC: No focal weakness or paresthesias are detected. SKIN: There are no ulcers or rashes noted. PSYCHIATRIC: The patient has a normal affect.  STUDIES:   I have reviewed the following studies: Upper extremity  Normal upper extremity digit PPG waveforms at rest and after 5 minutes of warming.  ASSESSMENT and PLAN   Raynaud's phenomenon: The patient does not have an  obstructive component to her symptoms.  The management of her symptoms has been improved with  amlodipine.  I do not think there is a role for surgical intervention at this time.  We did have a limited discussion regarding digital sympathectomy as well as the use of topical agents such as nitroglycerin, etc.  None of these treatments are warranted at this time as her symptoms are rather mild.  We discussed the importance of avoiding extremes in temperature.  She was satisfied with our conversation, knowing that she does not have an obstructive vascular component to her disease.  She will follow-up with me on an as-needed basis.   Charlena CrossWells Claramae Rigdon, IV, MD, FACS [facility] Tel [phone] Pager [phone]

## 2018-10-16 ENCOUNTER — Other Ambulatory Visit: Payer: Self-pay | Admitting: Family Medicine

## 2018-10-16 DIAGNOSIS — Z1231 Encounter for screening mammogram for malignant neoplasm of breast: Secondary | ICD-10-CM

## 2018-12-03 ENCOUNTER — Other Ambulatory Visit: Payer: Self-pay

## 2018-12-03 ENCOUNTER — Ambulatory Visit
Admission: RE | Admit: 2018-12-03 | Discharge: 2018-12-03 | Disposition: A | Discharge status: Home / Self Care | Payer: BC Managed Care – PPO | Source: Ambulatory Visit | Attending: Family Medicine | Admitting: Family Medicine

## 2018-12-03 DIAGNOSIS — Z1231 Encounter for screening mammogram for malignant neoplasm of breast: Secondary | ICD-10-CM

## 2018-12-04 ENCOUNTER — Other Ambulatory Visit: Payer: Self-pay | Admitting: Family Medicine

## 2018-12-04 DIAGNOSIS — R928 Other abnormal and inconclusive findings on diagnostic imaging of breast: Secondary | ICD-10-CM

## 2018-12-07 ENCOUNTER — Ambulatory Visit
Admission: RE | Admit: 2018-12-07 | Discharge: 2018-12-07 | Disposition: A | Discharge status: Home / Self Care | Payer: BC Managed Care – PPO | Source: Ambulatory Visit | Attending: Family Medicine | Admitting: Family Medicine

## 2018-12-07 ENCOUNTER — Other Ambulatory Visit: Payer: Self-pay

## 2018-12-07 DIAGNOSIS — R928 Other abnormal and inconclusive findings on diagnostic imaging of breast: Secondary | ICD-10-CM

## 2018-12-08 ENCOUNTER — Telehealth: Payer: Self-pay | Admitting: Oncology

## 2018-12-08 NOTE — Telephone Encounter (Signed)
Received a staff msg from Terie Purser at [facility] to schedule Ms. Wheat for the high risk breast clinic. Ms. Hand has been cld and scheduled to see Dr. Jana Hakim on 10/7 at 4pm. She's aware to arrive 15 minutes early.

## 2018-12-29 NOTE — Progress Notes (Addendum)
Cynthia Chase  [phone] Fax:([phone]     ID: Cynthia Chase DOB: 09/15/1980  MR#: [id number]  XAJ#:[id number]  Patient Care Team: Cynthia Bis, MD as PCP - General (Family Medicine) Cynthia Binder, MD as Consulting Physician (Gastroenterology) Cynthia Mitchell, MD as Consulting Physician (Vascular Surgery) Cynthia Chase, Cynthia Dad, MD as Consulting Physician (Oncology) Cynthia Cruel, MD OTHER MD: Dr. Kendall Chase (OBGYN)  CHIEF COMPLAINT: high risk for breast cancer  CURRENT TREATMENT: Intensified screening; consider tamoxifen   HISTORY OF CURRENT ILLNESS: Cynthia Chase has a family history of breast cancer, detailed below.  It was recommended she start mammography early, and she had her first screening mammography on 12/03/2018 showing a possible abnormality in the right breast. She underwent right diagnostic mammography with tomography and ultrasonography at [facility] on 12/07/2018 showing: breast density category C; benign cyst within the right breast at the 10 o'clock axis, measuring 7 mm, corresponding to the mammographic finding.  She was referred to the high-risk clinic for further discussion of risk management.  Her subsequent history is as detailed below.   INTERVAL HISTORY: Cynthia Chase was evaluated in the high risk breast cancer clinic on 12/30/2018.  Of note, she underwent hysterectomy on 01/03/2015 under Cynthia Chase, for pelvic pain and possible endometriosis. Pathology from the procedure ([id number]) showed: benign endometrium; adenomyosis in the myometrium; nabothian cysts and squamous metaplasia in the cervix, and benign bilateral fallopian tubes.   She also underwent right ovary removal on 06/12/2017, with pathology ([id number]) showinga benign 2.5 cm follicular cyst.   REVIEW OF SYSTEMS: Cynthia Chase denies unusual headaches, visual changes, nausea, vomiting, stiff neck, dizziness, or gait imbalance. There has been no  cough, phlegm production, or pleurisy, no chest pain or pressure, and no change in bowel or bladder habits. The patient denies fever, rash, bleeding, unexplained fatigue or unexplained weight loss.  She is athletic, used to play basketball in school, and is still active in sports.  A detailed review of systems was otherwise entirely negative.   PAST MEDICAL HISTORY: Past Medical History:  Diagnosis Date   Abnormal Pap smear    2006   Anemia    With pregnancy   Arthritis    Osteoarthritis; bilateral knees   Headache    Migraines   History of anal fissures    saw Dr Cynthia Chase gastro for eval or rectal bleeding and lower abdominal pain, diagnostic colonoscopy 2017;  denies current abdomnal symptoms    PONV (postoperative nausea and vomiting)    does well with scopolamine    Raynaud disease    Right ovarian cyst    Shingles    3rd and 4th grade; denies flare-ups    Varicella    as a child   Wears glasses     PAST SURGICAL HISTORY: Past Surgical History:  Procedure Laterality Date   ABDOMINAL HYSTERECTOMY     ADENOIDECTOMY     still has tonsils    ARTHROSCOPIC REPAIR ACL Right 1998, 2003   Dr Cynthia Chase    ARTHROSCOPIC REPAIR ACL Left 2000   Dr Cynthia Chase   BILATERAL SALPINGECTOMY Bilateral 01/03/2015   Procedure: BILATERAL SALPINGECTOMY;  Surgeon: Cynthia Manges, MD;  Location: [facility];  Service: Gynecology;  Laterality: Bilateral;   BUNIONECTOMY  2012   done by surgeon at [facility]( [facility]), patient does not remember name of surgeon    COLONOSCOPY N/A 08/04/2015   Procedure: COLONOSCOPY;  Surgeon: Cynthia Binder, MD;  Location[facility];  Service: Endoscopy;  Laterality: N/A;  [place] OF UTERUS  2008   Dr Cynthia Chase at [place] N/A 01/03/2015   Procedure: LAPAROSCOPIC ASSISTED VAGINAL HYSTERECTOMY;  Surgeon: Cynthia Manges, MD;   Location: [facility];  Service: Gynecology;  Laterality: N/A;   WISDOM TOOTH EXTRACTION      FAMILY HISTORY: Family History  Problem Relation Age of Onset   Hyperlipidemia Mother    Cancer Mother    Cancer Maternal Grandfather    Colon polyps Father    Breast cancer Paternal Aunt    Breast cancer Paternal Grandmother    Breast cancer Cousin    Colon cancer Neg Hx    Ulcerative colitis Neg Hx    Crohn's disease Neg Hx    The patient's father is 61 year old as of October 2020.  He has 1 sister, who had breast cancer, unknown age of initial diagnosis.  That sister has a daughter, the patient's first cousin, also with breast cancer, diagnosed before the age of 42.  The patient's father had 1 brother who died in his 43s from lung cancer in the setting of tobacco abuse.  The patient's father's mother had breast cancer, age of diagnosis unknown.  The patient's father's father had pancreatic cancer and his nephew, the patient is father's brother son, also had pancreatic cancer.  On the maternal side the patient's mother is 32 years old as of October 2020.  The maternal grandfather had carcinoid.  There is no history of breast ovarian or pancreatic cancer on the maternal side of the family.  The patient herself has 1 brother, no sisters, with no history of cancer.   GYNECOLOGIC HISTORY:  Patient's last menstrual period was 11/18/2014. Menarche: 38 years old Age at first live birth: 38 years old [place] P 2 Contraception: She took oral contraceptives for many years with no clotting complications Hysterectomy? yes BSO? Right only; patient has no symptoms to alert her to left ovarian cycling   SOCIAL HISTORY: (updated 12/2018)  Cynthia Chase's family O's rakes are all insurance, working with contractors primarily.  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  Her husband Cynthia Chase teaches physical education at the [employer].  Their children are Cynthia Chase, 17 years old and  Cynthia Chase 36 years old as of October 2020.  Cynthia Chase attends a [employer] locally    ADVANCED DIRECTIVES: In the absence of any documents to the contrary the patient's husband is her healthcare power of attorney   HEALTH MAINTENANCE: Social History   Tobacco Use   Smoking status: Never Smoker   Smokeless tobacco: Never Used   Tobacco comment: Never smoked  Substance Use Topics   Alcohol use: Yes    Alcohol/week: 0.0 standard drinks    Comment: occassional   Drug use: No     Colonoscopy: 2018/Dr. fields  PAP: s/p hysterectomy (previous: 2015, atypical)  Bone density: Never   Allergies  Allergen Reactions   Gentamicin     Eye drop form, caused itchy red eyes    Adhesive [Tape]     Band aids; casue redness    Sulfa Antibiotics Rash    Current Outpatient Medications  Medication Sig Dispense Refill   amLODipine (NORVASC) 5 MG tablet TAKE 1 TABLET BY MOUTH ONCE DAILY FOR RAYNAUDS     Lactobacillus (PROBIOTIC CHILDRENS) CHEW Chew by mouth daily.  meloxicam (MOBIC) 15 MG tablet 15 mg daily.      No current facility-administered medications for this visit.     OBJECTIVE: Young white woman in no acute distress  Vitals:   12/30/18 1604  BP: 111/81  Pulse: 66  Resp: 18  Temp: 98.7 F (37.1 C)  SpO2: 100%     Body mass index is 22.9 kg/m.   Wt Readings from Last 3 Encounters:  12/30/18 148 lb 6.4 oz (67.3 kg)  09/07/18 147 lb (66.7 kg)  06/12/17 158 lb 12.8 oz (72 kg)      ECOG FS:0 - Asymptomatic  Ocular: Sclerae unicteric, pupils round and equal Ear-nose-throat: Wearing a mask Lymphatic: No cervical or supraclavicular adenopathy Lungs no rales or rhonchi Heart regular rate and rhythm Abd soft, nontender, positive bowel sounds MSK no focal spinal tenderness, no joint edema Neuro: non-focal, well-oriented, appropriate affect Breasts: There are no skin or nipple changes of concern.  Both breasts are normally lumpy for age.  Both axillae are  benign.   LAB RESULTS:  CMP     Component Value Date/Time   NA 134 (L) 08/17/2010 1311   K 3.3 (L) 08/17/2010 1311   CL 97 08/17/2010 1311   CO2 26 08/17/2010 1311   GLUCOSE 103 (H) 08/17/2010 1311   BUN 8 08/17/2010 1311   CREATININE 0.49 08/17/2010 1311   CALCIUM 9.5 08/17/2010 1311   PROT 6.6 08/17/2010 1311   ALBUMIN 2.8 (L) 08/17/2010 1311   AST 11 08/17/2010 1311   ALT 9 08/17/2010 1311   ALKPHOS 72 08/17/2010 1311   BILITOT 0.1 (L) 08/17/2010 1311   GFRNONAA >60 08/17/2010 1311   GFRAA  08/17/2010 1311    >60        The eGFR has been calculated using the MDRD equation. This calculation has not been validated in all clinical situations. eGFR's persistently <60 mL/min signify possible Chronic Kidney Disease.    No results found for: TOTALPROTELP, ALBUMINELP, A1GS, A2GS, BETS, BETA2SER, GAMS, MSPIKE, SPEI  No results found for: KPAFRELGTCHN, LAMBDASER, KAPLAMBRATIO  Lab Results  Component Value Date   WBC 6.0 06/05/2017   HGB 13.6 06/05/2017   HCT 38.7 06/05/2017   MCV 92.8 06/05/2017   PLT 208 06/05/2017    @LASTCHEMISTRY @  No results found for: LABCA2  No components found for: PFXTKW409  No results for input(s): INR in the last 168 hours.  No results found for: LABCA2  No results found for: BDZ329  No results found for: JME268  No results found for: TMH962  No results found for: CA2729  No components found for: HGQUANT  No results found for: CEA1 / No results found for: CEA1   No results found for: AFPTUMOR  No results found for: CHROMOGRNA  No results found for: PSA1  No visits with results within 3 Day(s) from this visit.  Latest known visit with results is:  Admission on 06/12/2017, Discharged on 06/12/2017  Component Date Value Ref Range Status   WBC 06/05/2017 6.0  4.0 - 10.5 K/uL Final   RBC 06/05/2017 4.17  3.87 - 5.11 MIL/uL Final   Hemoglobin 06/05/2017 13.6  12.0 - 15.0 g/dL Final   HCT 06/05/2017 38.7  36.0 - 46.0  % Final   MCV 06/05/2017 92.8  78.0 - 100.0 fL Final   MCH 06/05/2017 32.6  26.0 - 34.0 pg Final   MCHC 06/05/2017 35.1  30.0 - 36.0 g/dL Final   RDW 06/05/2017 11.4* 11.5 - 15.5 % Final  Platelets 06/05/2017 208  150 - 400 K/uL Final   Performed at [facility], [address] [place], [place] [address]    (this displays the last labs from the last 3 days)  No results found for: TOTALPROTELP, ALBUMINELP, A1GS, A2GS, BETS, BETA2SER, GAMS, MSPIKE, SPEI (this displays SPEP labs)  No results found for: KPAFRELGTCHN, LAMBDASER, KAPLAMBRATIO (kappa/lambda light chains)  No results found for: HGBA, HGBA2QUANT, HGBFQUANT, HGBSQUAN (Hemoglobinopathy evaluation)   No results found for: LDH  No results found for: IRON, TIBC, IRONPCTSAT (Iron and TIBC)  No results found for: FERRITIN  Urinalysis    Component Value Date/Time   COLORURINE YELLOW 08/13/2010 2025   APPEARANCEUR CLEAR 08/13/2010 2025   LABSPEC <1.005 (L) 08/13/2010 2025   PHURINE 6.5 08/13/2010 2025   GLUCOSEU NEGATIVE 08/13/2010 2025   HGBUR LARGE (A) 08/13/2010 2025   BILIRUBINUR NEGATIVE 08/13/2010 2025   KETONESUR NEGATIVE 08/13/2010 2025   PROTEINUR NEGATIVE 08/13/2010 2025   UROBILINOGEN 0.2 08/13/2010 2025   NITRITE NEGATIVE 08/13/2010 2025   LEUKOCYTESUR NEGATIVE 08/13/2010 2025     STUDIES: Mm Digital Screening Bilateral  Result Date: 12/03/2018 CLINICAL DATA:  Screening. EXAM: DIGITAL SCREENING BILATERAL MAMMOGRAM WITH CAD COMPARISON:  None ACR Breast Density Category c: The breast tissue is heterogeneously dense, which may obscure small masses. FINDINGS: In the right breast, a possible asymmetry warrants further evaluation. In the left breast, no findings suspicious for malignancy. Images were processed with CAD. IMPRESSION: Further evaluation is suggested for possible asymmetry in the right breast. RECOMMENDATION: Diagnostic mammogram and possibly ultrasound of the right breast.  (Code:[id number]) The patient will be contacted regarding the findings, and additional imaging will be scheduled. BI-RADS CATEGORY  0: Incomplete. Need additional imaging evaluation and/or prior mammograms for comparison. Electronically Signed   By: Lajean Manes M.D.   On: 12/03/2018 15:58   US Breast Ltd Uni Right Inc Axilla  Addendum Date: 12/09/2018   ADDENDUM REPORT: 12/09/2018 08:39 ADDENDUM: A referral to the High Risk Breast Clinic at [facility] has been arranged with Dr. Tressa Chase on December 30, 2018. Cynthia Purser, RN on 12/09/2018. Electronically Signed   By: Franki Cabot M.D.   On: 12/09/2018 08:39   Result Date: 12/09/2018 CLINICAL DATA:  Patient returns today to evaluate a possible RIGHT breast asymmetry questioned on recent baseline screening mammogram.Family history of breast cancer EXAM: DIGITAL DIAGNOSTIC RIGHT MAMMOGRAM WITH CAD AND TOMO ULTRASOUND RIGHT BREAST COMPARISON:  Baseline screening mammogram dated 12/03/2018. ACR Breast Density Category c: The breast tissue is heterogeneously dense, which may obscure small masses. FINDINGS: On today's additional diagnostic views, a partially obscured mass is confirmed within the slightly outer RIGHT breast, at posterior depth, measuring approximately 6 mm greatest dimension. Mammographic images were processed with CAD. Targeted ultrasound is performed, evaluating the entire upper and outer RIGHT breast, showing a benign cyst in the RIGHT breast at the 10 o'clock axis, 4 cm from the nipple, at posterior depth, measuring 7 mm, corresponding to the mammographic finding. No suspicious solid or cystic mass is identified. IMPRESSION: 1. No evidence of malignancy within the RIGHT breast. 2. Benign cyst within the RIGHT breast at the 10 o'clock axis, measuring 7 mm, corresponding to the mammographic finding. RECOMMENDATION: 1.  Screening mammogram in one year.(Code:SM-B-01Y) 2. Given patient's family history of breast cancer, and  dense breasts, would consider the addition of annual screening breast MRI to annual screening mammography. Per American Cancer Society guidelines, annual screening MRI of the breasts  is recommended if a risk assessment calculation for breast cancer, preferably using the Tyrer-Cuzick model, measures greater than 20%. Patient requests consultation at a high risk screening clinic. This will be arranged by our nurse navigator. I have discussed the findings and recommendations with the patient. If applicable, a reminder letter will be sent to the patient regarding the next appointment. BI-RADS CATEGORY  2: Benign. Electronically Signed: By: Franki Cabot M.D. On: 12/07/2018 13:56   Mm Diag Breast Tomo Uni Right  Addendum Date: 12/09/2018   ADDENDUM REPORT: 12/09/2018 08:39 ADDENDUM: A referral to the High Risk Breast Clinic at [facility] has been arranged with Dr. Tressa Chase on December 30, 2018. Cynthia Purser, RN on 12/09/2018. Electronically Signed   By: Franki Cabot M.D.   On: 12/09/2018 08:39   Result Date: 12/09/2018 CLINICAL DATA:  Patient returns today to evaluate a possible RIGHT breast asymmetry questioned on recent baseline screening mammogram.Family history of breast cancer EXAM: DIGITAL DIAGNOSTIC RIGHT MAMMOGRAM WITH CAD AND TOMO ULTRASOUND RIGHT BREAST COMPARISON:  Baseline screening mammogram dated 12/03/2018. ACR Breast Density Category c: The breast tissue is heterogeneously dense, which may obscure small masses. FINDINGS: On today's additional diagnostic views, a partially obscured mass is confirmed within the slightly outer RIGHT breast, at posterior depth, measuring approximately 6 mm greatest dimension. Mammographic images were processed with CAD. Targeted ultrasound is performed, evaluating the entire upper and outer RIGHT breast, showing a benign cyst in the RIGHT breast at the 10 o'clock axis, 4 cm from the nipple, at posterior depth, measuring 7 mm, corresponding to  the mammographic finding. No suspicious solid or cystic mass is identified. IMPRESSION: 1. No evidence of malignancy within the RIGHT breast. 2. Benign cyst within the RIGHT breast at the 10 o'clock axis, measuring 7 mm, corresponding to the mammographic finding. RECOMMENDATION: 1.  Screening mammogram in one year.(Code:SM-B-01Y) 2. Given patient's family history of breast cancer, and dense breasts, would consider the addition of annual screening breast MRI to annual screening mammography. Per American Cancer Society guidelines, annual screening MRI of the breasts is recommended if a risk assessment calculation for breast cancer, preferably using the Tyrer-Cuzick model, measures greater than 20%. Patient requests consultation at a high risk screening clinic. This will be arranged by our nurse navigator. I have discussed the findings and recommendations with the patient. If applicable, a reminder letter will be sent to the patient regarding the next appointment. BI-RADS CATEGORY  2: Benign. Electronically Signed: By: Franki Cabot M.D. On: 12/07/2018 13:56    ELIGIBLE FOR AVAILABLE RESEARCH PROTOCOL: no  ASSESSMENT: 38 y.o. [place] woman with a significant family history for breast and pancreatic cancer and a predicted lifetime risk of breast cancer of 22.6%  (1) considering tamoxifen for risk reduction  (2) starting intensified screening  PLAN: I spent approximately 60 minutes face to face with Cynthia Chase with more than 50% of that time spent in counseling and coordination of care. Specifically we reviewed the biology of the patient's diagnosis and the specifics of her situation.  We reviewed her family history and ran the  [employer] which is detailed above.  It predicts a greater than 20% lifetime risk of breast cancer.    There are several approaches to risk reduction for her to consider, and some that are discouraged. We do not encourage bilateral mastectomies in this setting. There are  other ways of dealing with this risk that are much less drastic.  A better approach to risk  reduction in this setting involves anti-estrogens. Most breast cancers are estrogen dependent and if they are deprived of estrogen they will have trouble developing.  In premenopausal women like Cynthia Chase tamoxifen or raloxifene (Evista) will cut the risk of breast cancer developing in half.  These drugs, block the estrogen receptor in breast cells so that the breast cells cannot "eat" estrogen.  In other tissues they can have estrogenic effects including blood clots for example, at a similar risk as oral contraceptives.  Another approach is not to address the risk directly, but instead to intensify screening. This would involve obtaining an MRI of the breast yearly in addition to yearly mammography with tomography. The problems with this approach, aside from cost, is false positives. Breast MRI is very sensitive, and may find some lesions that may not be important but which may require biopsy or further studies.  On the plus side, this very sensitive test has been shown to be very effective at detecting cancer at the earliest possible stage.  After much discussion Cynthia Chase is very interested in intensified screening.  We are operational lysing that by starting a breast MRI in December.  She will then have her mammogram next June.  We will continue to alternate these 2 tests on an every six-month basis as is the standard of care.  She is also interested in tamoxifen, which her cousin is taking, but not quite ready to start it.  I am placing the prescription so she will have it available if she wishes.  If she does decide to start she will give [place] a call and let us know the date of starting.  She has a good understanding of the possible toxicities side effects and complications of this agent.  Tentatively I am going to see her again late June of next year, after her mammography.  Lesia has a good understanding  of the overall plan. She agrees with it. She knows the goal of treatment in her case is prevention. She will call with any problems that may develop before her next visit here.   Cynthia Cruel, MD   12/30/2018 5:15 PM Medical Oncology and Hematology [facility] [place], [place] [address] Tel. [phone]    Fax. [phone]   This document serves as a record of services personally performed by Lurline Del, MD. It was created on his behalf by Wilburn Mylar, a trained medical scribe. The creation of this record is based on the scribe's personal observations and the provider's statements to them.   I, Lurline Del MD, have reviewed the above documentation for accuracy and completeness, and I agree with the above.  ADDENDUM: There is a significant family history of pancreatic cancer on the father's side.  We discussed the fact that there is no standard screening.  We do offer MRCP for some patients but we have no data that this is effective, may lead to unnecessary procedures, and I would not recommend it in cases like hers outside of a study.

## 2018-12-30 ENCOUNTER — Other Ambulatory Visit: Payer: Self-pay

## 2018-12-30 ENCOUNTER — Inpatient Hospital Stay: Payer: BC Managed Care – PPO | Attending: Oncology | Admitting: Oncology

## 2018-12-30 DIAGNOSIS — Z9079 Acquired absence of other genital organ(s): Secondary | ICD-10-CM | POA: Diagnosis not present

## 2018-12-30 DIAGNOSIS — Z90710 Acquired absence of both cervix and uterus: Secondary | ICD-10-CM | POA: Diagnosis not present

## 2018-12-30 DIAGNOSIS — Z9189 Other specified personal risk factors, not elsewhere classified: Secondary | ICD-10-CM | POA: Insufficient documentation

## 2018-12-30 DIAGNOSIS — Z803 Family history of malignant neoplasm of breast: Secondary | ICD-10-CM | POA: Insufficient documentation

## 2018-12-30 DIAGNOSIS — Z1239 Encounter for other screening for malignant neoplasm of breast: Secondary | ICD-10-CM | POA: Insufficient documentation

## 2018-12-30 DIAGNOSIS — Z90721 Acquired absence of ovaries, unilateral: Secondary | ICD-10-CM | POA: Insufficient documentation

## 2018-12-30 DIAGNOSIS — Z800 Family history of malignant neoplasm of digestive organs: Secondary | ICD-10-CM | POA: Diagnosis not present

## 2018-12-30 MED ORDER — TAMOXIFEN CITRATE 20 MG PO TABS: 20.0000 mg | ORAL_TABLET | Freq: Every day | ORAL | 12 refills | Status: AC

## 2018-12-31 ENCOUNTER — Telehealth: Payer: Self-pay | Admitting: Oncology

## 2018-12-31 NOTE — Telephone Encounter (Signed)
I talk with patient regarding schedule  

## 2019-03-03 ENCOUNTER — Telehealth: Payer: Self-pay

## 2019-03-03 NOTE — Telephone Encounter (Signed)
RN notified MD of patient tolerating 1/2 tablet of Tamoxifen with no side effects.  Per MD, patient to continue on half tablet.  Pt aware, no further needs.

## 2019-03-03 NOTE — Telephone Encounter (Signed)
RN spoke with patient, patient updating that she has started taking Tamoxifen on 10/23.  Pt reports taking 1/2 tablet daily, with no side effects.

## 2019-03-09 ENCOUNTER — Ambulatory Visit
Admission: RE | Admit: 2019-03-09 | Discharge: 2019-03-09 | Disposition: A | Discharge status: Home / Self Care | Payer: BC Managed Care – PPO | Source: Ambulatory Visit | Attending: Oncology | Admitting: Oncology

## 2019-03-09 ENCOUNTER — Other Ambulatory Visit: Payer: Self-pay | Admitting: Oncology

## 2019-03-09 DIAGNOSIS — Z9189 Other specified personal risk factors, not elsewhere classified: Secondary | ICD-10-CM

## 2019-03-09 DIAGNOSIS — Z1239 Encounter for other screening for malignant neoplasm of breast: Secondary | ICD-10-CM

## 2019-03-09 MED ORDER — GADOBUTROL 1 MMOL/ML IV SOLN
6.0000 mL | Freq: Once | INTRAVENOUS | Status: AC | PRN
  Administered 2019-03-09: 11:00:00 6 mL via INTRAVENOUS

## 2019-03-09 NOTE — Progress Notes (Signed)
I called Cynthia Chase to let her know that there are a couple abnormalities in the recent breast MRI requiring biopsy.  She is agreeable to proceeding.  Hopefully we can get them done within the next few days.

## 2019-03-22 ENCOUNTER — Other Ambulatory Visit: Payer: Self-pay | Admitting: Radiology

## 2019-03-22 ENCOUNTER — Ambulatory Visit
Admission: RE | Admit: 2019-03-22 | Discharge: 2019-03-22 | Disposition: A | Discharge status: Home / Self Care | Payer: BC Managed Care – PPO | Source: Ambulatory Visit | Attending: Oncology | Admitting: Oncology

## 2019-03-22 ENCOUNTER — Other Ambulatory Visit: Payer: Self-pay

## 2019-03-22 DIAGNOSIS — Z1239 Encounter for other screening for malignant neoplasm of breast: Secondary | ICD-10-CM

## 2019-03-22 MED ORDER — GADOBUTROL 1 MMOL/ML IV SOLN
6.0000 mL | Freq: Once | INTRAVENOUS | Status: AC | PRN
  Administered 2019-03-22: 6 mL via INTRAVENOUS

## 2019-05-24 ENCOUNTER — Encounter: Payer: Self-pay | Admitting: Gastroenterology

## 2019-05-24 NOTE — Progress Notes (Signed)
Primary Care Physician:  Caryl Bis, MD Primary Gastroenterologist:  Dr. Oneida Alar   Chief Complaint  Patient presents with  . Constipation    had x-ray at PCP  . Abdominal Pain    right side  . Hemorrhoids    painful    HPI:   Cynthia Chase is a 39 y.o. female presenting today with history of constipation, anal fissure, and hemorrhoids,  with last colonoscopy 2017.  For about a month to 1.5 months feeling bloated, gassy. Drinking soda would cause discomfort in right lower side. Cut out soda and went straight water for awhile, which helped. Last Wednesday drank a small amount of soda and right side hurt again. Had gas pain in lower abdomen later that night. Couldn't have a BM. Felt abdominal discomfort and pressure. Felt similar to gas pains lower abdomen. Called PCP and had UA that was fine. Completed outside xray and told she was "backed up". Took Ex-lax, Mag Citrate, enema and had some success. Took exlax again and an enema the next day. Still doesn't feel right. History of endometriosis and would have pain in RLQ previously. History of right oophorectomy. +straining. Usually would have BM once or twice per day.   Hemorrhoid causing pain but no bleeding. Doesn't feel like protruding.   Linzess caused cramping in the past. Had been fine for awhile until recently. Placed on Tamoxifen prophylactically due family history of breast cancer.   Past Medical History:  Diagnosis Date  . Abnormal Pap smear    2006  . Anemia    With pregnancy  . Arthritis    Osteoarthritis; bilateral knees  . Headache    Migraines  . History of anal fissures    saw Dr Barney Drain gastro for eval or rectal bleeding and lower abdominal pain, diagnostic colonoscopy 2017;  denies current abdomnal symptoms   . PONV (postoperative nausea and vomiting)    does well with scopolamine   . Raynaud disease   . Right ovarian cyst   . Shingles    3rd and 4th grade; denies flare-ups   . Varicella    as a child  . Wears glasses     Past Surgical History:  Procedure Laterality Date  . ABDOMINAL HYSTERECTOMY    . ADENOIDECTOMY     still has tonsils   . ARTHROSCOPIC REPAIR ACL Right 1998, 2003   Dr Sydnee Cabal   . ARTHROSCOPIC REPAIR ACL Left 2000   Dr Sydnee Cabal  . BILATERAL SALPINGECTOMY Bilateral 01/03/2015   Procedure: BILATERAL SALPINGECTOMY;  Surgeon: Eldred Manges, MD;  Location: [facility];  Service: Gynecology;  Laterality: Bilateral;  . BUNIONECTOMY  2012   done by surgeon at [facility]( [facility]), patient does not remember name of surgeon   . COLONOSCOPY N/A 08/04/2015   non-bleeding external and internal hemorrhoids, anal fissure  . DILATION AND CURETTAGE OF UTERUS  2008   Dr Cletis Media at [place]    . LAPAROSCOPIC ASSISTED VAGINAL HYSTERECTOMY N/A 01/03/2015   Procedure: LAPAROSCOPIC ASSISTED VAGINAL HYSTERECTOMY;  Surgeon: Eldred Manges, MD;  Location: [facility];  Service: Gynecology;  Laterality: N/A;  . WISDOM TOOTH EXTRACTION      Current Outpatient Medications  Medication Sig Dispense Refill  . amLODipine (NORVASC) 5 MG tablet Take 10 mg by mouth daily.     . Lactobacillus (PROBIOTIC CHILDRENS) CHEW Chew by mouth daily.    . meloxicam (MOBIC) 15 MG tablet  15 mg as needed.     . tamoxifen (NOLVADEX) 20 MG tablet Take 0.5 tablets by mouth daily.     No current facility-administered medications for this visit.    Allergies as of 05/25/2019 - Review Complete 05/25/2019  Allergen Reaction Noted  . Gentamicin  07/27/2015  . Adhesive [tape]  05/27/2017  . Sulfa antibiotics Rash 11/16/2010    Family History  Problem Relation Age of Onset  . Hyperlipidemia Mother   . Cancer Mother   . Cancer Maternal Grandfather   . Colon polyps Father   . Breast cancer Paternal Aunt   . Breast cancer Paternal Grandmother   . Breast cancer Cousin   . Colon cancer Neg Hx   . Ulcerative colitis Neg Hx   . Crohn's disease Neg Hx       Social History   Socioeconomic History  . Marital status: Married    Spouse name: Not on file  . Number of children: Not on file  . Years of education: Not on file  . Highest education level: Not on file  Occupational History  . Not on file  Tobacco Use  . Smoking status: Never Smoker  . Smokeless tobacco: Never Used  . Tobacco comment: Never smoked  Substance and Sexual Activity  . Alcohol use: Not Currently    Alcohol/week: 0.0 standard drinks    Comment: occassional  . Drug use: No  . Sexual activity: Yes    Birth control/protection: Post-menopausal  Other Topics Concern  . Not on file  Social History Narrative   SELLS INSURANCE FOR A LIVING. 2 KIDS(4,7)-MARRIED. BORN AND RAISED IN [place] WENT TO [employer] AND PLAYED BASKETBALL.    Social Determinants of Health   Financial Resource Strain:   . Difficulty of Paying Living Expenses: Not on file  Food Insecurity:   . Worried About Programme researcher, broadcasting/film/video in the Last Year: Not on file  . Ran Out of Food in the Last Year: Not on file  Transportation Needs:   . Lack of Transportation (Medical): Not on file  . Lack of Transportation (Non-Medical): Not on file  Physical Activity:   . Days of Exercise per Week: Not on file  . Minutes of Exercise per Session: Not on file  Stress:   . Feeling of Stress : Not on file  Social Connections:   . Frequency of Communication with Friends and Family: Not on file  . Frequency of Social Gatherings with Friends and Family: Not on file  . Attends Religious Services: Not on file  . Active Member of Clubs or Organizations: Not on file  . Attends Banker Meetings: Not on file  . Marital Status: Not on file  Intimate Partner Violence:   . Fear of Current or Ex-Partner: Not on file  . Emotionally Abused: Not on file  . Physically Abused: Not on file  . Sexually Abused: Not on file    Review of Systems: Gen: Denies any fever, chills, fatigue, weight loss, lack of  appetite.  CV: Denies chest pain, heart palpitations, peripheral edema, syncope.  Resp: Denies shortness of breath at rest or with exertion. Denies wheezing or cough.  GI: see HPI GU : Denies urinary burning, urinary frequency, urinary hesitancy MS: Denies joint pain, muscle weakness, cramps, or limitation of movement.  Derm: Denies rash, itching, dry skin Psych: Denies depression, anxiety, memory loss, and confusion Heme: Denies bruising, bleeding, and enlarged lymph nodes.  Physical Exam: BP 106/69   Pulse  72   Temp (!) 97.1 F (36.2 C) (Temporal)   Ht 5' 7.5" (1.715 m)   Wt 144 lb (65.3 kg)   LMP 11/18/2014 Comment: partial   BMI 22.22 kg/m  General:   Alert and oriented. Pleasant and cooperative. Well-nourished and well-developed.  Head:  Normocephalic and atraumatic. Eyes:  Without icterus, sclera clear and conjunctiva pink.  Ears:  Normal auditory acuity. Lungs:  Clear to auscultation bilaterally. No wheezes, rales, or rhonchi. No distress.  Heart:  S1, S2 present without murmurs appreciated.  Abdomen:  +BS, soft, mild TTP RLQ and non-distended. No HSM noted. No guarding or rebound. No masses appreciated.  Rectal:  No obvious fissure. No external hemorrhoids. DRE with possible right posterior enlarged internal hemorrhoid.  Msk:  Symmetrical without gross deformities. Normal posture. Extremities:  Without edema. Neurologic:  Alert and  oriented x4;  grossly normal neurologically. Skin:  Intact without significant lesions or rashes. Psych:  Alert and cooperative. Normal mood and affect.  ASSESSMENT: Cynthia Chase is a 39 y.o. female presenting today with recurrent constipation, RLQ pain, and symptomatic hemorrhoids.  Constipation: will trial Amitiza 8 mcg first as 24 mcg samples not available. May need to titrate this up. Start po BID with food to avoid nausea.   RLQ pain: history of same historically but improved s/p hysterectomy for endometriosis. Query constipation  playing a role. Low threshold for CT if no improvement in next 24-48 hours after constipation addressed. Patient to call in next 24-48 hours and will pursue CT if no significant improvement.   Hemorrhoids: from description, likely Grade 2 hemorrhoid. No obvious fissure on exam. Will treat with Anusol. Consider banding in future if continues to be symptomatic despite bowel regimen and supportive care.    PLAN:  Amitiza 8 mcg po BID; may need to titrate up  Call if no improvement in next 24-48 hours  Anusol BID per rectum sent to pharmacy  Will attempt to retrieve outside xray   2-3 month follow-up   Gelene Mink, PhD, ANP-BC [facility]

## 2019-05-25 ENCOUNTER — Encounter: Payer: Self-pay | Admitting: Gastroenterology

## 2019-05-25 ENCOUNTER — Ambulatory Visit: Payer: BC Managed Care – PPO | Admitting: Gastroenterology

## 2019-05-25 ENCOUNTER — Other Ambulatory Visit: Payer: Self-pay

## 2019-05-25 VITALS — BP 106/69 | HR 72 | Temp 97.1°F | Ht 67.5 in | Wt 144.0 lb

## 2019-05-25 DIAGNOSIS — R1031 Right lower quadrant pain: Secondary | ICD-10-CM | POA: Diagnosis not present

## 2019-05-25 DIAGNOSIS — K6289 Other specified diseases of anus and rectum: Secondary | ICD-10-CM | POA: Diagnosis not present

## 2019-05-25 DIAGNOSIS — K5900 Constipation, unspecified: Secondary | ICD-10-CM

## 2019-05-25 MED ORDER — HYDROCORTISONE (PERIANAL) 2.5 % EX CREA: 1.0000 "application " | TOPICAL_CREAM | Freq: Two times a day (BID) | CUTANEOUS | 1 refills | Status: DC

## 2019-05-25 NOTE — Patient Instructions (Signed)
I have sent in a cream to your pharmacy to use per rectum twice per day. Let me know if this does not improve things.  For constipation: start with Amitiza 1 gelcap twice a day with food (to avoid nausea). We may need to increase this dose.   If your pain is not better in next 24-48 hours, we will pursue a CT scan.  We will see you in 3 months for follow-up!   It was a pleasure to see you today. I want to create trusting relationships with patients to provide genuine, compassionate, and quality care. I value your feedback. If you receive a survey regarding your visit,  I greatly appreciate you taking time to fill this out.   Gelene Mink, PhD, ANP-BC [facility]

## 2019-05-26 ENCOUNTER — Telehealth: Payer: Self-pay | Admitting: Gastroenterology

## 2019-05-26 DIAGNOSIS — R1031 Right lower quadrant pain: Secondary | ICD-10-CM

## 2019-05-26 NOTE — Addendum Note (Signed)
Addended by: Corrie Mckusick on: 05/26/2019 10:55 AM   Modules accepted: Orders

## 2019-05-26 NOTE — Telephone Encounter (Signed)
PA for CT abd/pelvis submitted via [employer] website. Case approved. Order ID: [id number], valid 05/26/19-11/21/19.  CT scheduled for 05/28/19 at 12:00pm, arrive at 11:45am. NPO 4 hours before test. Pick up contrast prior to test.   Called and informed pt of appt.

## 2019-05-26 NOTE — Telephone Encounter (Signed)
Let's go ahead and order CT abd/pelvis with contrast ASAP due to RLQ abdominal pain.

## 2019-05-26 NOTE — Telephone Encounter (Signed)
Pt was seen yesterday and said she wasn't feeling any better today and wants to go ahead and schedule a CT. [phone]

## 2019-05-27 ENCOUNTER — Telehealth: Payer: Self-pay | Admitting: Gastroenterology

## 2019-05-27 MED ORDER — LUBIPROSTONE 24 MCG PO CAPS: 24.0000 ug | ORAL_CAPSULE | Freq: Two times a day (BID) | ORAL | 3 refills | Status: DC

## 2019-05-27 NOTE — Addendum Note (Signed)
Addended by: Gelene Mink on: 05/27/2019 12:45 PM   Modules accepted: Orders

## 2019-05-27 NOTE — Telephone Encounter (Signed)
Is she taking it twice a day? I can send in the higher dosage of Amitiza. She is on 8 mcg right now as that was the only sample we had available. For now, may take Miralax daily to BID as needed. Let me know if she wants me to go ahead and send prescription for higher dosage of Amitiza. I feel that may be helpful.

## 2019-05-27 NOTE — Telephone Encounter (Signed)
Amitiza 24 mcg BID [facility] in [place].

## 2019-05-27 NOTE — Telephone Encounter (Signed)
Called pt and she is aware of new RX and informed her to take it twice a day.  Pt voiced understanding.

## 2019-05-27 NOTE — Telephone Encounter (Signed)
Called pt and she said that she is currently taking Amitiza twice a day.  She does want [place] to fax a RX to [facility] [place] for a higher dosage of Amitiza.  Pt was advised that she could also use Miralax daily to BID as needed.  Pt voiced understanding.

## 2019-05-27 NOTE — Telephone Encounter (Signed)
Called pt back.  Pt says Amitiza seemed to help yesterday for about 12 hours.  She said it seems like all of her symptoms which include constipation, bloating, and abd pain are back today.  Pt says she is scheduled for a CT scan tomorrow.

## 2019-05-27 NOTE — Telephone Encounter (Signed)
Pt has questions about her medication for constipation and if she can take other meds with it. Please advise. [phone]

## 2019-05-28 ENCOUNTER — Other Ambulatory Visit: Payer: Self-pay

## 2019-05-28 ENCOUNTER — Ambulatory Visit (HOSPITAL_COMMUNITY)
Admission: RE | Admit: 2019-05-28 | Discharge: 2019-05-28 | Disposition: A | Discharge status: Home / Self Care | Payer: BC Managed Care – PPO | Source: Ambulatory Visit | Attending: Gastroenterology | Admitting: Gastroenterology

## 2019-05-28 DIAGNOSIS — R1031 Right lower quadrant pain: Secondary | ICD-10-CM | POA: Diagnosis present

## 2019-05-28 MED ORDER — IOHEXOL 300 MG/ML  SOLN
100.0000 mL | Freq: Once | INTRAMUSCULAR | Status: AC | PRN
  Administered 2019-05-28: 100 mL via INTRAVENOUS

## 2019-05-29 ENCOUNTER — Telehealth: Payer: Self-pay | Admitting: Internal Medicine

## 2019-05-29 NOTE — Telephone Encounter (Signed)
Patient called this morning.  3 doses of Amitiza 24 -  no results as of yet continues to have right-sided abdominal pain.  Review chart and recent CT.  No new symptoms.  I recommended continuing Amitiza 24 twice daily.  Use MiraLAX 17 g orally twice daily through the weekend.  Call us back on Monday with a progress report.  Given her history, chronic appendicitis, adhesive disease/endometrial implants would remain a potential cause of right lower quadrant abdominal pain if not relieved with effective management of constipation.

## 2019-05-31 NOTE — Telephone Encounter (Signed)
Spoke with pt. Pt is aware of results. Pt knows we're not referring her to surgery unless pt continues to have pain ect. Pt would like to know who she would be referred to in the event she was referred to surgery.

## 2019-05-31 NOTE — Telephone Encounter (Signed)
Spoke with pt. Constipation is better since she went on the higher dosage of Amitiza 24 mcg bid, Miralax bid. Rt side abdominal pain is doing better than it was when pt spoke with RMR. Pt has had a little diarrhea and may cut back on the Miralax to see if that helps. Pt wanted to mention to AB, that the CT scan said her rt ovary was normal. Pt states she had her ovary removed due to endometriosis. Pt's pain that she was having was on the rt side. Pt just wants to make sure the ovary isn't in place or there was left over endometriosis.

## 2019-05-31 NOTE — Telephone Encounter (Signed)
I believe prior surgery was done with [facility], so would refer there first.

## 2019-05-31 NOTE — Telephone Encounter (Signed)
I reviewed CT with radiologist. 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 Nothing acute to explain pain. If this continues despite bowel regimen, we need to refer to surgery.

## 2019-05-31 NOTE — Telephone Encounter (Signed)
Noted  

## 2019-06-01 NOTE — Telephone Encounter (Signed)
Noted. Pt is aware of where of the surgical center she will be referred to if a referral is needed.

## 2019-06-01 NOTE — Telephone Encounter (Signed)
REVIEWED. AGREE. NO ADDITIONAL RECOMMENDATIONS. 

## 2019-06-16 ENCOUNTER — Telehealth: Payer: Self-pay | Admitting: Gastroenterology

## 2019-06-16 NOTE — Telephone Encounter (Signed)
Pt said the amitiza 24 mcg was making her sick and was there something else she could take. [phone]

## 2019-06-16 NOTE — Telephone Encounter (Signed)
Called pt and she stated she is taking [place] with food and it is working. However, the medication it is making her very nauseous and wants to know if there is something else she can take other than linzess because she took that in the past and it gave her bad abd pain

## 2019-06-16 NOTE — Telephone Encounter (Signed)
Called pt and left vm  

## 2019-06-17 NOTE — Telephone Encounter (Signed)
PLEASE ADDRESS WITH PT.

## 2019-06-18 NOTE — Telephone Encounter (Signed)
We can trial Trulance. Please provide samples. Amitiza class side effect is nausea.

## 2019-06-21 NOTE — Telephone Encounter (Signed)
How you would like the pt to take trulane we only have 2 boxes of samples that have 3 pills per box  Also, do you want pt to d/c amitiza?

## 2019-06-21 NOTE — Telephone Encounter (Signed)
She can take Trulance once each morning, with or without food. Do not take Amitiza while taking Trulance. We need progress report in about 1 week.

## 2019-06-21 NOTE — Telephone Encounter (Signed)
Called pt lmom to return my call. 

## 2019-06-22 NOTE — Telephone Encounter (Signed)
Called pt verified dob pt stated she is out of town but will pick up the samples on 06/28/19. Told mrs Bonaparte She can take Trulance once each morning, with or without food. Do not take Amitiza while taking Trulance. We need progress report in about 1 week. she stated she would and that she understood

## 2019-07-13 ENCOUNTER — Other Ambulatory Visit: Payer: Self-pay | Admitting: Oncology

## 2019-07-19 ENCOUNTER — Telehealth: Payer: Self-pay | Admitting: *Deleted

## 2019-07-19 ENCOUNTER — Telehealth: Payer: Self-pay | Admitting: Oncology

## 2019-07-19 NOTE — Telephone Encounter (Signed)
This RN spoke with pt per her call stating ongoing issues with constipation since starting the tamoxifen.  " Can tamoxifen cause constipation ?"  She states she has intermittent pain in her lower abd primarily at site of where she had her right ovary removed secondary to severe endometrosis.  Pt has also had a hysterectomy due with endometrosis as a contributing factor.  She states her current symptoms are reminiscent of when her endometrosis was more severe (prior to surgeries).  This RN discussed possible irritation endometrial tissue and tamoxifen.  Constipation is becoming more of a interference in her ADL's as well as physically uncomfortable.  Per phone discussion- Cynthia Chase will hold further tamoxifen and not change any other factors or current interventions she is using.  She will monitor symptoms over the next month.  Appointment for late July rescheduled to 1st week in June so she can discuss with Dr Darnelle Catalan for further recommendations.  Cynthia Chase stated appreciation of discussion.

## 2019-07-19 NOTE — Telephone Encounter (Signed)
Rescheduled appts per provider PAL. Pt confirmed new appt date and time. Pt had a question regarding medication. Pt was transferred to [facility] nurse.

## 2019-08-11 ENCOUNTER — Other Ambulatory Visit: Payer: Self-pay

## 2019-08-11 ENCOUNTER — Encounter: Payer: Self-pay | Admitting: Gastroenterology

## 2019-08-11 ENCOUNTER — Ambulatory Visit: Payer: BC Managed Care – PPO | Admitting: Gastroenterology

## 2019-08-11 VITALS — BP 98/67 | HR 62 | Temp 97.3°F | Ht 67.5 in | Wt 145.6 lb

## 2019-08-11 DIAGNOSIS — K5900 Constipation, unspecified: Secondary | ICD-10-CM | POA: Diagnosis not present

## 2019-08-11 MED ORDER — MOTEGRITY 2 MG PO TABS: 1.0000 | ORAL_TABLET | Freq: Every day | ORAL | 5 refills | Status: DC

## 2019-08-11 NOTE — Patient Instructions (Signed)
I sent in [place] to your pharmacy, as we don't have samples. I included a savings card and starter kit for you today.   Please let me know if not covered. We would go back to Trulance in that case.  We will see you in 6 months or sooner!  I enjoyed seeing you again today! As you know, I value our relationship and want to provide genuine, compassionate, and quality care. I welcome your feedback. If you receive a survey regarding your visit,  I greatly appreciate you taking time to fill this out. See you next time!  Annitta Needs, PhD, ANP-BC [facility]

## 2019-08-11 NOTE — Progress Notes (Signed)
Referring Provider: Richardean Chimera, MD Primary Care Physician:  Richardean Chimera, MD Primary GI: previously Dr. Darrick Penna    Chief Complaint  Patient presents with  . Constipation    Amitiza causes upset stomach    HPI:   Cynthia Chase is a 39 y.o. female presenting today with a history of constipation, anal fissure, hemorrhoids, and last colonoscopy in 2017. Last seen March 2021 with RLQ and constipation.   Linzess has caused cramping in the past. Started Amitiza at last visit, increased to 24 mcg po BID. This is not tolerated well due to nausea. CT March 2021 with moderate stool in colon.  Trialed a few doses of Trulance. Came off Tamoxifen. Took Amitiza 24 mcg once. Has tried just doing Miralax which has helped some. Only pain if getting constipated.   Past Medical History:  Diagnosis Date  . Abnormal Pap smear    2006  . Anemia    With pregnancy  . Arthritis    Osteoarthritis; bilateral knees  . Headache    Migraines  . History of anal fissures    saw Dr Jonette Eva gastro for eval or rectal bleeding and lower abdominal pain, diagnostic colonoscopy 2017;  denies current abdomnal symptoms   . PONV (postoperative nausea and vomiting)    does well with scopolamine   . Raynaud disease   . Right ovarian cyst   . Shingles    3rd and 4th grade; denies flare-ups   . Varicella    as a child  . Wears glasses     Past Surgical History:  Procedure Laterality Date  . ABDOMINAL HYSTERECTOMY    . ADENOIDECTOMY     still has tonsils   . ARTHROSCOPIC REPAIR ACL Right 1998, 2003   Dr Eugenia Mcalpine   . ARTHROSCOPIC REPAIR ACL Left 2000   Dr Eugenia Mcalpine  . BILATERAL SALPINGECTOMY Bilateral 01/03/2015   Procedure: BILATERAL SALPINGECTOMY;  Surgeon: Hal Morales, MD;  Location: [facility];  Service: Gynecology;  Laterality: Bilateral;  . BUNIONECTOMY  2012   done by surgeon at [facility] patient does not remember name of surgeon   .  COLONOSCOPY N/A 08/04/2015   non-bleeding external and internal hemorrhoids, anal fissure  . DILATION AND CURETTAGE OF UTERUS  2008   Dr [name] at [facility]   . KNEE SURGERY    . LAPAROSCOPIC ASSISTED VAGINAL HYSTERECTOMY N/A 01/03/2015   Procedure: LAPAROSCOPIC ASSISTED VAGINAL HYSTERECTOMY;  Surgeon: Hal Morales, MD;  Location: [facility];  Service: Gynecology;  Laterality: N/A;  . WISDOM TOOTH EXTRACTION      Current Outpatient Medications  Medication Sig Dispense Refill  . amLODipine (NORVASC) 5 MG tablet Take 10 mg by mouth daily.     . Lactobacillus (PROBIOTIC CHILDRENS) CHEW Chew by mouth daily.    Marland Kitchen lubiprostone (AMITIZA) 24 MCG capsule Take 1 capsule (24 mcg total) by mouth 2 (two) times daily with a meal. (Patient taking differently: Take 24 mcg by mouth as needed. ) 60 capsule 3  . meloxicam (MOBIC) 15 MG tablet 15 mg as needed.     . polyethylene glycol (MIRALAX / GLYCOLAX) 17 g packet Take 17 g by mouth daily as needed.    . hydrocortisone (ANUSOL-HC) 2.5 % rectal cream Place 1 application rectally 2 (two) times daily. (Patient not taking: Reported on 08/11/2019) 30 g 1  . tamoxifen (NOLVADEX) 20 MG tablet Take 0.5 tablets by mouth daily.  No current facility-administered medications for this visit.    Allergies as of 08/11/2019 - Review Complete 08/11/2019  Allergen Reaction Noted  . Gentamicin  07/27/2015  . Adhesive [tape]  05/27/2017  . Sulfa antibiotics Rash 11/16/2010    Family History  Problem Relation Age of Onset  . Hyperlipidemia Mother   . Cancer Mother   . Cancer Maternal Grandfather   . Colon polyps Father   . Breast cancer Paternal Aunt   . Breast cancer Paternal Grandmother   . Breast cancer Cousin   . Colon cancer Neg Hx   . Ulcerative colitis Neg Hx   . Crohn's disease Neg Hx     Social History   Socioeconomic History  . Marital status: Married    Spouse name: Not on file  . Number of children: Not on file  . Years of education:  Not on file  . Highest education level: Not on file  Occupational History  . Not on file  Tobacco Use  . Smoking status: Never Smoker  . Smokeless tobacco: Never Used  . Tobacco comment: Never smoked  Substance and Sexual Activity  . Alcohol use: Not Currently    Alcohol/week: 0.0 standard drinks    Comment: occassional  . Drug use: No  . Sexual activity: Yes    Birth control/protection: Post-menopausal  Other Topics Concern  . Not on file  Social History Narrative   SELLS INSURANCE FOR A LIVING. 2 KIDS(4,7)-MARRIED. BORN AND RAISED IN [place]. WENT TO [employer] AND PLAYED BASKETBALL.    Social Determinants of Health   Financial Resource Strain:   . Difficulty of Paying Living Expenses:   Food Insecurity:   . Worried About Charity fundraiser in the Last Year:   . Arboriculturist in the Last Year:   Transportation Needs:   . Film/video editor (Medical):   Marland Kitchen Lack of Transportation (Non-Medical):   Physical Activity:   . Days of Exercise per Week:   . Minutes of Exercise per Session:   Stress:   . Feeling of Stress :   Social Connections:   . Frequency of Communication with Friends and Family:   . Frequency of Social Gatherings with Friends and Family:   . Attends Religious Services:   . Active Member of Clubs or Organizations:   . Attends Archivist Meetings:   Marland Kitchen Marital Status:     Review of Systems: Gen: Denies fever, chills, anorexia. Denies fatigue, weakness, weight loss.  CV: Denies chest pain, palpitations, syncope, peripheral edema, and claudication. Resp: Denies dyspnea at rest, cough, wheezing, coughing up blood, and pleurisy. GI: see HPI Derm: Denies rash, itching, dry skin Psych: Denies depression, anxiety, memory loss, confusion. No homicidal or suicidal ideation.  Heme: Denies bruising, bleeding, and enlarged lymph nodes.  Physical Exam: BP 98/67   Pulse 62   Temp (!) 97.3 F (36.3 C) (Temporal)   Ht 5' 7.5" (1.715 m)   Wt  145 lb 9.6 oz (66 kg)   LMP 11/18/2014 Comment: partial   BMI 22.47 kg/m  General:   Alert and oriented. No distress noted. Pleasant and cooperative.  Head:  Normocephalic and atraumatic. Eyes:  Conjuctiva clear without scleral icterus. Mouth:  Mask in place  Abdomen:  +BS, soft, non-tender and non-distended. No rebound or guarding. No HSM or masses noted. Msk:  Symmetrical without gross deformities. Normal posture. Extremities:  Without edema. Neurologic:  Alert and  oriented x4 Psych:  Alert and cooperative.  Normal mood and affect.  ASSESSMENT: Cynthia Chase is a 39 y.o. female presenting today with history of IBS-C, failing Linzess due to cramping and unable to tolerate Amitiza due to class side effect of nausea.  She has trialed a few doses of Trulance. Eager to find a regimen that will work well for her. We will trial Motegrity 2 mg daily. She is to call with update. No alarm signs/symptoms. If Motegrity is not covered, will do Trulance.   PLAN:  Motegrity 2 mg tablets, take one each day sent to pharmacy  Trulance if Motegrity not covered  Return in 6 months or sooner if needed.   Gelene Mink, PhD, ANP-BC [facility]astroenter[facility]

## 2019-08-17 NOTE — Progress Notes (Signed)
Cc'ed to pcp °

## 2019-08-24 ENCOUNTER — Other Ambulatory Visit: Payer: Self-pay | Admitting: *Deleted

## 2019-08-24 DIAGNOSIS — Z9189 Other specified personal risk factors, not elsewhere classified: Secondary | ICD-10-CM

## 2019-08-24 DIAGNOSIS — Z8742 Personal history of other diseases of the female genital tract: Secondary | ICD-10-CM

## 2019-08-24 DIAGNOSIS — Z1239 Encounter for other screening for malignant neoplasm of breast: Secondary | ICD-10-CM

## 2019-08-24 DIAGNOSIS — N736 Female pelvic peritoneal adhesions (postinfective): Secondary | ICD-10-CM

## 2019-08-24 DIAGNOSIS — N80109 Endometriosis of ovary, unspecified side, unspecified depth: Secondary | ICD-10-CM

## 2019-08-24 DIAGNOSIS — R102 Pelvic and perineal pain unspecified side: Secondary | ICD-10-CM

## 2019-08-25 ENCOUNTER — Inpatient Hospital Stay: Payer: BC Managed Care – PPO

## 2019-08-25 ENCOUNTER — Other Ambulatory Visit: Payer: Self-pay

## 2019-08-25 ENCOUNTER — Inpatient Hospital Stay: Payer: BC Managed Care – PPO | Attending: Oncology | Admitting: Oncology

## 2019-08-25 VITALS — BP 105/61 | HR 73 | Temp 98.5°F | Resp 20 | Ht 67.5 in | Wt 149.2 lb

## 2019-08-25 DIAGNOSIS — Z90710 Acquired absence of both cervix and uterus: Secondary | ICD-10-CM | POA: Insufficient documentation

## 2019-08-25 DIAGNOSIS — Z801 Family history of malignant neoplasm of trachea, bronchus and lung: Secondary | ICD-10-CM | POA: Diagnosis not present

## 2019-08-25 DIAGNOSIS — Z800 Family history of malignant neoplasm of digestive organs: Secondary | ICD-10-CM | POA: Diagnosis not present

## 2019-08-25 DIAGNOSIS — Z9079 Acquired absence of other genital organ(s): Secondary | ICD-10-CM | POA: Insufficient documentation

## 2019-08-25 DIAGNOSIS — Z9189 Other specified personal risk factors, not elsewhere classified: Secondary | ICD-10-CM

## 2019-08-25 DIAGNOSIS — N80109 Endometriosis of ovary, unspecified side, unspecified depth: Secondary | ICD-10-CM

## 2019-08-25 DIAGNOSIS — N736 Female pelvic peritoneal adhesions (postinfective): Secondary | ICD-10-CM

## 2019-08-25 DIAGNOSIS — Z803 Family history of malignant neoplasm of breast: Secondary | ICD-10-CM | POA: Diagnosis not present

## 2019-08-25 DIAGNOSIS — R102 Pelvic and perineal pain: Secondary | ICD-10-CM

## 2019-08-25 DIAGNOSIS — Z8742 Personal history of other diseases of the female genital tract: Secondary | ICD-10-CM

## 2019-08-25 DIAGNOSIS — Z1239 Encounter for other screening for malignant neoplasm of breast: Secondary | ICD-10-CM

## 2019-08-25 LAB — CBC WITH DIFFERENTIAL (CANCER CENTER ONLY)
Abs Immature Granulocytes: 0.01 10*3/uL (ref 0.00–0.07)
Basophils Absolute: 0 10*3/uL (ref 0.0–0.1)
Basophils Relative: 1 %
Eosinophils Absolute: 0.2 10*3/uL (ref 0.0–0.5)
Eosinophils Relative: 3 %
HCT: 38.5 % (ref 36.0–46.0)
Hemoglobin: 13 g/dL (ref 12.0–15.0)
Immature Granulocytes: 0 %
Lymphocytes Relative: 26 %
Lymphs Abs: 1.6 10*3/uL (ref 0.7–4.0)
MCH: 32.7 pg (ref 26.0–34.0)
MCHC: 33.8 g/dL (ref 30.0–36.0)
MCV: 96.7 fL (ref 80.0–100.0)
Monocytes Absolute: 0.3 10*3/uL (ref 0.1–1.0)
Monocytes Relative: 5 %
Neutro Abs: 4.1 10*3/uL (ref 1.7–7.7)
Neutrophils Relative %: 65 %
Platelet Count: 192 10*3/uL (ref 150–400)
RBC: 3.98 MIL/uL (ref 3.87–5.11)
RDW: 11.2 % — ABNORMAL LOW (ref 11.5–15.5)
WBC Count: 6.3 10*3/uL (ref 4.0–10.5)
nRBC: 0 % (ref 0.0–0.2)

## 2019-08-25 LAB — CMP (CANCER CENTER ONLY)
ALT: 9 U/L (ref 0–44)
AST: 15 U/L (ref 15–41)
Albumin: 4.1 g/dL (ref 3.5–5.0)
Alkaline Phosphatase: 57 U/L (ref 38–126)
Anion gap: 9 (ref 5–15)
BUN: 14 mg/dL (ref 6–20)
CO2: 29 mmol/L (ref 22–32)
Calcium: 9.3 mg/dL (ref 8.9–10.3)
Chloride: 101 mmol/L (ref 98–111)
Creatinine: 0.83 mg/dL (ref 0.44–1.00)
GFR, Est AFR Am: >60 mL/min (ref >60)
GFR, Estimated: >60 mL/min (ref >60)
Glucose, Bld: 87 mg/dL (ref 70–99)
Potassium: 3.6 mmol/L (ref 3.5–5.1)
Sodium: 139 mmol/L (ref 135–145)
Total Bilirubin: 0.6 mg/dL (ref 0.3–1.2)
Total Protein: 7.1 g/dL (ref 6.5–8.1)

## 2019-08-25 NOTE — Progress Notes (Signed)
[facility]  [phone] [phone]     ID: Cynthia Chase DOB: September 04, 1980  MR#: [id number]  XTG#:[id number]  Patient Care Team: Richardean Chimera, MD as PCP - General (Family Medicine) West Bali, MD (Inactive) as Consulting Physician (Gastroenterology) Nada Libman, MD as Consulting Physician (Vascular Surgery) Desree Leap, Valentino Hue, MD as Consulting Physician (Oncology) Corky Sox, MD as Consulting Physician (Radiology) Lowella Dell, MD OTHER MD: Dr. Dierdre Forth (OBGYN)  CHIEF COMPLAINT: high risk for breast cancer  CURRENT TREATMENT: Intensified screening   INTERVAL HISTORY: Cynthia Chase returns today for follow up of her high risk for breast cancer. She was evaluated in the high risk breast cancer clinic on 12/30/2018.  She began tamoxifen on 01/15/2019 at a half tablet per day. She began to experience constipation and subsequently underwent CT abdomen/pelvis on 05/28/2019 showing benign findings.  However the pain she was having was exactly where she usually had pain from endometriosis and it appears that tamoxifen was stimulating that problem.  She contacted our office on 07/19/2019, and I recommended she hold the tamoxifen for now.  She has felt much better off the medication.  Since her last visit, she underwent breast MRI on 03/09/2019 showing: breast composition C; indeterminate 5 mm enhancing central left breast mass; indeterminate non-mass enhancement in upper-outer left breast spanning 3.4 cm; no evidence of malignancy on right or of lymphadenopathy.  She proceeded to biopsy of the left breast areas in question on 03/22/2019. Pathology from the procedure ([id number]) showed fibrocystic change and pseudoangiomatous stromal hyperplasia.   REVIEW OF SYSTEMS: Cynthia Chase is very active, very busy with family life, and has a very normal functional status for her age.  She has gone off several medications in addition to tamoxifen  and that also seems to have helped in terms of her sense of wellbeing.  A detailed review of systems today was otherwise noncontributory.  HISTORY OF CURRENT ILLNESS: From the original intake note:  Cynthia Chase has a family history of breast cancer, detailed below.  It was recommended she start mammography early, and she had her first screening mammography on 12/03/2018 showing a possible abnormality in the right breast. She underwent right diagnostic mammography with tomography and ultrasonography at [facility] on 12/07/2018 showing: breast density category C; benign cyst within the right breast at the 10 o'clock axis, measuring 7 mm, corresponding to the mammographic finding.  She was referred to the high-risk clinic for further discussion of risk management.  Her subsequent history is as detailed below.   PAST MEDICAL HISTORY: Past Medical History:  Diagnosis Date   Abnormal Pap smear    2006   Anemia    With pregnancy   Arthritis    Osteoarthritis; bilateral knees   Headache    Migraines   History of anal fissures    saw Dr Jonette Eva gastro for eval or rectal bleeding and lower abdominal pain, diagnostic colonoscopy 2017;  denies current abdomnal symptoms    PONV (postoperative nausea and vomiting)    does well with scopolamine    Raynaud disease    Right ovarian cyst    Shingles    3rd and 4th grade; denies flare-ups    Varicella    as a child   Wears glasses     PAST SURGICAL HISTORY: Past Surgical History:  Procedure Laterality Date   ABDOMINAL HYSTERECTOMY     ADENOIDECTOMY     still has tonsils    ARTHROSCOPIC REPAIR  ACL Right 1998, 2003   Dr Sydnee Cabal    ARTHROSCOPIC REPAIR ACL Left 2000   Dr Sydnee Cabal   BILATERAL SALPINGECTOMY Bilateral 01/03/2015   Procedure: BILATERAL SALPINGECTOMY;  Surgeon: Eldred Manges, MD;  Location: [facility];  Service: Gynecology;  Laterality: Bilateral;   BUNIONECTOMY  2012   done by surgeon  at [facility]( [facility]), patient does not remember name of surgeon    COLONOSCOPY N/A 08/04/2015   non-bleeding external and internal hemorrhoids, anal fissure   DILATION AND CURETTAGE OF UTERUS  2008   Dr Cletis Media at [facility] N/A 01/03/2015   Procedure: [place];  Surgeon: Eldred Manges, MD;  Location: [facility];  Service: Gynecology;  Laterality: N/A;   WISDOM TOOTH EXTRACTION      FAMILY HISTORY: Family History  Problem Relation Age of Onset   Hyperlipidemia Mother    Cancer Mother    Cancer Maternal Grandfather    Colon polyps Father    Breast cancer Paternal Aunt    Breast cancer Paternal Grandmother    Breast cancer Cousin    Colon cancer Neg Hx    Ulcerative colitis Neg Hx    Crohn's disease Neg Hx    The patient's father is 55 year old as of October 2020.  He has 1 sister, who had breast cancer, unknown age of initial diagnosis.  That sister has a daughter, the patient's first cousin, also with breast cancer, diagnosed before the age of 38.  The patient's father had 1 brother who died in his 75s from lung cancer in the setting of tobacco abuse.  The patient's father's mother had breast cancer, age of diagnosis unknown.  The patient's father's father had pancreatic cancer and his nephew, the patient is father's brother son, also had pancreatic cancer.  On the maternal side the patient's mother is 63 years old as of October 2020.  The maternal grandfather had carcinoid.  There is no history of breast ovarian or pancreatic cancer on the maternal side of the family.  The patient herself has 1 brother, no sisters, with no history of cancer.   GYNECOLOGIC HISTORY:  Patient's last menstrual period was 11/18/2014. Menarche: 39 years old Age at first live birth: 39 years old [place] P 2 Contraception: She took oral contraceptives for many years with no  clotting complications Hysterectomy? yes BSO? Right only; patient has no symptoms to alert her to left ovarian cycling   SOCIAL HISTORY: (updated 12/2018)  Bertice's family O's rakes are all insurance, working with contractors primarily.  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  Her husband Larkin Ina teaches physical education at the [employer].  Their children are Terrence Dupont, 74 years old and Hunter 37 years old as of October 2020.  Barnetta Chapel attends a [employer] locally    ADVANCED DIRECTIVES: In the absence of any documents to the contrary the patient's husband is her healthcare power of attorney   HEALTH MAINTENANCE: Social History   Tobacco Use   Smoking status: Never Smoker   Smokeless tobacco: Never Used   Tobacco comment: Never smoked  Substance Use Topics   Alcohol use: Not Currently    Alcohol/week: 0.0 standard drinks    Comment: occassional   Drug use: No     Colonoscopy: 2018/Dr. fields  PAP: s/p hysterectomy (previous: 2015, atypical)  Bone density: Never   Allergies  Allergen Reactions   Gentamicin     Eye drop form, caused itchy red eyes    Adhesive [Tape]     Band aids; casue redness    Sulfa Antibiotics Rash    Current Outpatient Medications  Medication Sig Dispense Refill   Lactobacillus (PROBIOTIC CHILDRENS) CHEW Chew by mouth daily.     meloxicam (MOBIC) 15 MG tablet 15 mg as needed.      polyethylene glycol (MIRALAX / GLYCOLAX) 17 g packet Take 17 g by mouth daily as needed.     No current facility-administered medications for this visit.    OBJECTIVE: white woman in no acute distress  Vitals:   08/25/19 1407  BP: 105/61  Pulse: 73  Resp: 20  Temp: 98.5 F (36.9 C)  SpO2: 100%     Body mass index is 23.02 kg/m.   Wt Readings from Last 3 Encounters:  08/25/19 149 lb 3.2 oz (67.7 kg)  08/11/19 145 lb 9.6 oz (66 kg)  05/25/19 144 lb (65.3 kg)      ECOG FS:0 - Asymptomatic  Sclerae  unicteric, EOMs intact Wearing a mask No cervical or supraclavicular adenopathy Lungs no rales or rhonchi Heart regular rate and rhythm Abd soft, nontender, positive bowel sounds MSK no focal spinal tenderness, no upper extremity lymphedema Neuro: nonfocal, well oriented, appropriate affect Breasts: I do not palpate any mass in either breast.  There are no skin or nipple changes of concern.  Both axillae are benign.   LAB RESULTS:  CMP     Component Value Date/Time   NA 139 08/25/2019 1350   K 3.6 08/25/2019 1350   CL 101 08/25/2019 1350   CO2 29 08/25/2019 1350   GLUCOSE 87 08/25/2019 1350   BUN 14 08/25/2019 1350   CREATININE 0.83 08/25/2019 1350   CALCIUM 9.3 08/25/2019 1350   PROT 7.1 08/25/2019 1350   ALBUMIN 4.1 08/25/2019 1350   AST 15 08/25/2019 1350   ALT 9 08/25/2019 1350   ALKPHOS 57 08/25/2019 1350   BILITOT 0.6 08/25/2019 1350   GFRNONAA >60 08/25/2019 1350   GFRAA >60 08/25/2019 1350    No results found for: TOTALPROTELP, ALBUMINELP, A1GS, A2GS, BETS, BETA2SER, GAMS, MSPIKE, SPEI  No results found for: KPAFRELGTCHN, LAMBDASER, KAPLAMBRATIO  Lab Results  Component Value Date   WBC 6.3 08/25/2019   NEUTROABS 4.1 08/25/2019   HGB 13.0 08/25/2019   HCT 38.5 08/25/2019   MCV 96.7 08/25/2019   PLT 192 08/25/2019   No results found for: LABCA2  No components found for: [id number]  No results for input(s): INR in the last 168 hours.  No results found for: LABCA2  No results found for: YQM578  No results found for: ION629  No results found for: BMW413  No results found for: CA2729  No components found for: HGQUANT  No results found for: CEA1 / No results found for: CEA1   No results found for: AFPTUMOR  No results found for: CHROMOGRNA  No results found for: HGBA, HGBA2QUANT, HGBFQUANT, HGBSQUAN (Hemoglobinopathy evaluation)   No results found for: LDH  No results found for: IRON, TIBC, IRONPCTSAT (Iron and TIBC)  No results found  for: FERRITIN  Urinalysis    Component Value Date/Time   COLORURINE YELLOW 08/13/2010 2025   APPEARANCEUR CLEAR 08/13/2010 2025   LABSPEC <1.005 (L) 08/13/2010 2025   PHURINE 6.5 08/13/2010 2025   GLUCOSEU NEGATIVE 08/13/2010 2025   HGBUR LARGE (A) 08/13/2010 2025   BILIRUBINUR NEGATIVE 08/13/2010 2025   Cynthia Chase  NEGATIVE 08/13/2010 2025   PROTEINUR NEGATIVE 08/13/2010 2025   UROBILINOGEN 0.2 08/13/2010 2025   NITRITE NEGATIVE 08/13/2010 2025   LEUKOCYTESUR NEGATIVE 08/13/2010 2025    STUDIES: No results found.    ELIGIBLE FOR AVAILABLE RESEARCH PROTOCOL: no  ASSESSMENT: 39 y.o. [place] woman with a significant family history for breast and pancreatic cancer and a predicted lifetime risk of breast cancer of 22.6%  (1) took tamoxifen tamoxifen for a few months in 2020-21 with poor tolerance  (2) on intensified screening: Breast density category C  (a) mammography in June  (b) breast MRI in December  (c) biannual physician breast exam   PLAN:  Marlyne was unable to tolerate tamoxifen and we are stopping that medication.  She is not a candidate for aromatase inhibitors as she still has 1 ovary in place.  We are accordingly continuing intensified screening.  She feels she and her husband can afford this at this point. She will have mammography this month and breast MRI in December.  We will continue that pattern yearly until her breast density decreases  I have encouraged her to be as active and exercise as much as possible.  She will see me again in December after her MRI.  She knows to call for any other issue that may develop before then  Total encounter time 25 minutes.Lowella Dell, MD   08/25/2019 4:03 PM Medical Oncology and Hematology [facility] [address] [place], [place] [address] Tel. [phone]    Fax. [phone]   This document serves as a record of services personally performed by Ruthann Cancer, MD. It was created on  his behalf by Mickie Bail, a trained medical scribe. The creation of this record is based on the scribe's personal observations and the provider's statements to them.   I, Ruthann Cancer MD, have reviewed the above documentation for accuracy and completeness, and I agree with the above.   *Total Encounter Time as defined by the Centers for Medicare and Medicaid Services includes, in addition to the face-to-face time of a patient visit (documented in the note above) non-face-to-face time: obtaining and reviewing outside history, ordering and reviewing medications, tests or procedures, care coordination (communications with other health care professionals or caregivers) and documentation in the medical record.

## 2019-08-26 ENCOUNTER — Telehealth: Payer: Self-pay | Admitting: Oncology

## 2019-08-26 LAB — LUTEINIZING HORMONE: LH: 5.5 m[IU]/mL

## 2019-08-26 LAB — FOLLICLE STIMULATING HORMONE: FSH: 7.1 m[IU]/mL

## 2019-08-26 NOTE — Telephone Encounter (Signed)
Scheduled appts per 6/2 los. Pt confirmed appt date and time.  

## 2019-08-27 LAB — ESTRADIOL, ULTRA SENS: Estradiol, Sensitive: 96.8 pg/mL

## 2019-09-21 ENCOUNTER — Ambulatory Visit: Payer: BC Managed Care – PPO | Admitting: Oncology

## 2019-10-10 ENCOUNTER — Other Ambulatory Visit: Payer: Self-pay | Admitting: Gastroenterology

## 2019-10-10 NOTE — Telephone Encounter (Signed)
Please verify patient is no longer taking Linzess. Looks like Tobi Bastos was going to try her on Motegrity at her last office visit and the Rx states it was discontinued on 6/2 by Dr. Darnelle Catalan.

## 2019-10-11 NOTE — Telephone Encounter (Signed)
Called lmom

## 2019-10-12 ENCOUNTER — Inpatient Hospital Stay: Payer: BC Managed Care – PPO | Admitting: Oncology

## 2019-11-16 NOTE — Telephone Encounter (Signed)
Lmom for pt to call us back. 

## 2019-11-23 ENCOUNTER — Telehealth: Payer: Self-pay | Admitting: Internal Medicine

## 2019-11-23 NOTE — Telephone Encounter (Signed)
VM full and can not accept any messages. 

## 2019-11-23 NOTE — Telephone Encounter (Signed)
Pt called back.  She said that she did not need a refill for Amitiza.  She said that she isn't taking it.  She said she isn't taking Linzess either.  She wants to know if we can send a RX for Motegrity to [facility].

## 2019-11-23 NOTE — Telephone Encounter (Signed)
Spoke to pt about RX refill request.  See RX note.

## 2019-11-23 NOTE — Telephone Encounter (Signed)
Pt said she was returning a call. I didn't see a phone note of anyone here calling her. Please advise if you tried calling. [phone]

## 2019-11-24 MED ORDER — MOTEGRITY 2 MG PO TABS: 1.0000 | ORAL_TABLET | Freq: Every day | ORAL | 5 refills | Status: DC

## 2019-11-24 NOTE — Telephone Encounter (Signed)
[facility]gr[facility] sent to [facility] in [place].

## 2019-11-25 NOTE — Telephone Encounter (Signed)
Spoke with pt and she is aware that RX has been sent.

## 2019-12-08 ENCOUNTER — Ambulatory Visit: Payer: BC Managed Care – PPO

## 2019-12-15 ENCOUNTER — Ambulatory Visit: Payer: BC Managed Care – PPO

## 2020-01-10 ENCOUNTER — Ambulatory Visit
Admission: RE | Admit: 2020-01-10 | Discharge: 2020-01-10 | Disposition: A | Discharge status: Home / Self Care | Payer: BC Managed Care – PPO | Source: Ambulatory Visit | Attending: Oncology | Admitting: Oncology

## 2020-01-10 ENCOUNTER — Other Ambulatory Visit: Payer: Self-pay

## 2020-01-10 DIAGNOSIS — Z9189 Other specified personal risk factors, not elsewhere classified: Secondary | ICD-10-CM

## 2020-02-22 ENCOUNTER — Ambulatory Visit: Payer: BC Managed Care – PPO | Admitting: Gastroenterology

## 2020-02-27 NOTE — Progress Notes (Signed)
[facility]  Telephone:(336) [phone] Fax:(336) [phone]     ID: Cynthia Chase DOB: 01-12-1981  MR#: [id number]  YKD#:[id number]  Patient Care Team: Richardean Chimera, MD as PCP - General (Family Medicine) West Bali, MD (Inactive) as Consulting Physician (Gastroenterology) Nada Libman, MD as Consulting Physician (Vascular Surgery) Twanda Stakes, Valentino Hue, MD as Consulting Physician (Oncology) Corky Sox, MD as Consulting Physician (Radiology) Lowella Dell, MD OTHER MD: Dr. Dierdre Forth (OBGYN)  CHIEF COMPLAINT: high risk for breast cancer  CURRENT TREATMENT: Intensified screening   INTERVAL HISTORY: Cynthia Chase returns today for follow up of her high risk for breast cancer. She continues under intensified screening.  Recall she was unable to tolerate tamoxifen.  Since her last visit, she underwent bilateral screening mammography with tomography at [facility] on 01/10/2020 showing: breast density category C; no evidence of malignancy in either breast.    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  She also exercises about 30 minutes most days.   COVID 19 VACCINATION STATUS: Had Covid September 2021.  Is planning to start the vaccinations sometime in the spring 2022   HISTORY OF CURRENT ILLNESS: From the original intake note:  Cynthia Chase has a family history of breast cancer, detailed below.  It was recommended she start mammography early, and she had her first screening mammography on 12/03/2018 showing a possible abnormality in the right breast. She underwent right diagnostic mammography with tomography and ultrasonography at [facility] on 12/07/2018 showing: breast density category C; benign cyst within the right breast at the 10 o'clock axis, measuring 7 mm, corresponding to the mammographic finding.  She was referred to the high-risk clinic for  further discussion of risk management.  Her subsequent history is as detailed below.   PAST MEDICAL HISTORY: Past Medical History:  Diagnosis Date  . Abnormal Pap smear    2006  . Anemia    With pregnancy  . Arthritis    Osteoarthritis; bilateral knees  . Headache    Migraines  . History of anal fissures    saw Dr Jonette Eva gastro for eval or rectal bleeding and lower abdominal pain, diagnostic colonoscopy 2017;  denies current abdomnal symptoms   . PONV (postoperative nausea and vomiting)    does well with scopolamine   . Raynaud disease   . Right ovarian cyst   . Shingles    3rd and 4th grade; denies flare-ups   . Varicella    as a child  . Wears glasses     PAST SURGICAL HISTORY: Past Surgical History:  Procedure Laterality Date  . ABDOMINAL HYSTERECTOMY    . ADENOIDECTOMY     still has tonsils   . ARTHROSCOPIC REPAIR ACL Right 1998, 2003   Dr Eugenia Mcalpine   . ARTHROSCOPIC REPAIR ACL Left 2000   Dr Eugenia Mcalpine  . BILATERAL SALPINGECTOMY Bilateral 01/03/2015   Procedure: BILATERAL SALPINGECTOMY;  Surgeon: Hal Morales, MD;  Location: [facility];  Service: Gynecology;  Laterality: Bilateral;  . BUNIONECTOMY  2012   done by [facility] at [facility] patient does not remember name of surgeon   . COLONOSCOPY N/A 08/04/2015   non-bleeding external and internal hemorrhoids, anal fissure  . DILATION AND CURETTAGE OF UTERUS  2008   Dr [name] [facility]   . KNEE SURGERY    . LAPAROSCOPIC ASSISTED VAGINAL HYSTERECTOMY N/A  01/03/2015   Procedure: LAPAROSCOPIC ASSISTED VAGINAL HYSTERECTOMY;  Surgeon: Hal MoralesVanessa P Haygood, MD;  Location: [facility];  Service: Gynecology;  Laterality: N/A;  . WISDOM TOOTH EXTRACTION      FAMILY HISTORY: Family History  Problem Relation Age of Onset  . Hyperlipidemia Mother   . Cancer Mother   . Cancer Maternal Grandfather   . Colon polyps Father   . Breast cancer Paternal Aunt   . Breast cancer Paternal  Grandmother   . Breast cancer Cousin   . Colon cancer Neg Hx   . Ulcerative colitis Neg Hx   . Crohn's disease Neg Hx    The patient's father is 37 year Chase as of October 2020.  He has 1 sister, who had breast cancer, unknown age of initial diagnosis.  That sister has a daughter, the patient's first cousin, also with breast cancer, diagnosed before the age of 37.  The patient's father had 1 brother who died in his 3950s from lung cancer in the setting of tobacco abuse.  The patient's father's mother had breast cancer, age of diagnosis unknown.  The patient's father's father had pancreatic cancer and his nephew, the patient is father's brother son, also had pancreatic cancer.  On the maternal side the patient's mother is 37 years Chase as of October 2020.  The maternal grandfather had carcinoid.  There is no history of breast ovarian or pancreatic cancer on the maternal side of the family.  The patient herself has 1 brother, no sisters, with no history of cancer.     GYNECOLOGIC HISTORY:  Patient's last menstrual period was 11/18/2014. Menarche: 39 years Chase Age at first live birth: 39 years Chase GX P 2 Contraception: She took oral contraceptives for many years with no clotting complications Hysterectomy? yes BSO? 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  Her husband Cynthia Chase teaches physical education at the [employer].  Their children are Cynthia Chase, 37 years Chase and Cynthia Chase as of October 2020.  Cynthia Chase attends a [employer] locally    ADVANCED DIRECTIVES: In the absence of any documents to the contrary the patient's husband is her healthcare power of attorney   HEALTH MAINTENANCE: Social History   Tobacco Use  . Smoking status: Never Smoker  . Smokeless  tobacco: Never Used  . Tobacco comment: Never smoked  Vaping Use  . Vaping Use: Never used  Substance Use Topics  . Alcohol use: Not Currently    Alcohol/week: 0.0 standard drinks    Comment: occassional  . Drug use: No     Colonoscopy: 2018/Dr. fields  PAP: s/p hysterectomy (previous: 2015, atypical)  Bone density: Never   Allergies  Allergen Reactions  . Gentamicin     Eye drop form, caused itchy red eyes   . Adhesive [Tape]     Band aids; casue redness   . Sulfa Antibiotics Rash    Current Outpatient Medications  Medication Sig Dispense Refill  . Lactobacillus (PROBIOTIC CHILDRENS) CHEW Chew by mouth daily.    . meloxicam (MOBIC) 15 MG tablet 15 mg as needed.     . polyethylene glycol (MIRALAX / GLYCOLAX) 17 g packet Take 17 g by mouth daily as needed.    . Prucalopride Succinate (MOTEGRITY) 2 MG TABS Take 1 tablet (2 mg total) by mouth  daily. 30 tablet 5   No current facility-administered medications for this visit.    OBJECTIVE: white woman who appears well  Vitals:   02/28/20 1226  BP: 107/70  Pulse: 68  Resp: 18  Temp: 97.7 F (36.5 C)  SpO2: 100%     Body mass index is 22.45 kg/m.   Wt Readings from Last 3 Encounters:  02/28/20 145 lb 8 oz (66 kg)  08/25/19 149 lb 3.2 oz (67.7 kg)  08/11/19 145 lb 9.6 oz (66 kg)      ECOG FS:0 - Asymptomatic  Sclerae unicteric, EOMs intact Wearing a mask No cervical or supraclavicular adenopathy Lungs no rales or rhonchi Heart regular rate and rhythm Abd soft, nontender, positive bowel sounds MSK no focal spinal tenderness, no upper extremity lymphedema Neuro: nonfocal, well oriented, appropriate affect Breasts: There are no palpable masses in either breast.  There are no skin or nipple changes of concern.  Both axillae are benign.   LAB RESULTS:  CMP     Component Value Date/Time   NA 139 08/25/2019 1350   K 3.6 08/25/2019 1350   CL 101 08/25/2019 1350   CO2 29 08/25/2019 1350   GLUCOSE 87 08/25/2019  1350   BUN 14 08/25/2019 1350   CREATININE 0.83 08/25/2019 1350   CALCIUM 9.3 08/25/2019 1350   PROT 7.1 08/25/2019 1350   ALBUMIN 4.1 08/25/2019 1350   AST 15 08/25/2019 1350   ALT 9 08/25/2019 1350   ALKPHOS 57 08/25/2019 1350   BILITOT 0.6 08/25/2019 1350   GFRNONAA >60 08/25/2019 1350   GFRAA >60 08/25/2019 1350    No results found for: TOTALPROTELP, ALBUMINELP, A1GS, A2GS, BETS, BETA2SER, GAMS, MSPIKE, SPEI  No results found for: KPAFRELGTCHN, LAMBDASER, KAPLAMBRATIO  Lab Results  Component Value Date   WBC 6.3 08/25/2019   NEUTROABS 4.1 08/25/2019   HGB 13.0 08/25/2019   HCT 38.5 08/25/2019   MCV 96.7 08/25/2019   PLT 192 08/25/2019   No results found for: LABCA2  No components found for: MVEHMC947  No results for input(s): INR in the last 168 hours.  No results found for: LABCA2  No results found for: SJG283  No results found for: MOQ947  No results found for: MLY650  No results found for: CA2729  No components found for: HGQUANT  No results found for: CEA1 / No results found for: CEA1   No results found for: AFPTUMOR  No results found for: CHROMOGRNA  No results found for: HGBA, HGBA2QUANT, HGBFQUANT, HGBSQUAN (Hemoglobinopathy evaluation)   No results found for: LDH  No results found for: IRON, TIBC, IRONPCTSAT (Iron and TIBC)  No results found for: FERRITIN  Urinalysis    Component Value Date/Time   COLORURINE YELLOW 08/13/2010 2025   APPEARANCEUR CLEAR 08/13/2010 2025   LABSPEC <1.005 (L) 08/13/2010 2025   PHURINE 6.5 08/13/2010 2025   GLUCOSEU NEGATIVE 08/13/2010 2025   HGBUR LARGE (A) 08/13/2010 2025   BILIRUBINUR NEGATIVE 08/13/2010 2025   KETONESUR NEGATIVE 08/13/2010 2025   PROTEINUR NEGATIVE 08/13/2010 2025   UROBILINOGEN 0.2 08/13/2010 2025   NITRITE NEGATIVE 08/13/2010 2025   LEUKOCYTESUR NEGATIVE 08/13/2010 2025    STUDIES: No results found.    ELIGIBLE FOR AVAILABLE RESEARCH PROTOCOL: no  ASSESSMENT: 39 y.o.  [place] woman with a significant family history for breast and pancreatic cancer and a predicted lifetime risk of breast cancer of 22.6%  (1) took tamoxifen tamoxifen for a few months in 2020-21 with poor tolerance  (2) on intensified screening:  Breast density category C  (a) mammography alternating with breast MRI every 6 months  (b) continue intensified screening onto breast density B or less  (c) biannual physician breast exam   PLAN:  Kevionna did not have her mammography until October.  It makes more sense to move her MRI to May and then to do it in January.  Also she has changed her insurance and she now has a high deductible.  We discussed the possibility of "fast" MRI and that is what we are going to do this year.  Next year we can consider doing the "regular" MRI and we can continue to alternate those 2 forms of MRI on a yearly basis.  She knows to call for any other issue that may develop before the next visit  Total encounter time 20 minutes.Lowella Dell, MD   02/28/2020 12:45 PM Medical Oncology and Hematology [facility] [address] [place], [place] [address] Tel. [phone]    Fax. [phone]   This document serves as a record of services personally performed by Ruthann Cancer, MD. It was created on his behalf by Mickie Bail, a trained medical scribe. The creation of this record is based on the scribe's personal observations and the provider's statements to them.   I, Ruthann Cancer MD, have reviewed the above documentation for accuracy and completeness, and I agree with the above.   *Total Encounter Time as defined by the Centers for Medicare and Medicaid Services includes, in addition to the face-to-face time of a patient visit (documented in the note above) non-face-to-face time: obtaining and reviewing outside history, ordering and reviewing medications, tests or procedures, care coordination (communications with other health  care professionals or caregivers) and documentation in the medical record.

## 2020-02-28 ENCOUNTER — Other Ambulatory Visit: Payer: Self-pay

## 2020-02-28 ENCOUNTER — Inpatient Hospital Stay: Payer: BC Managed Care – PPO | Attending: Oncology | Admitting: Oncology

## 2020-02-28 VITALS — BP 107/70 | HR 68 | Temp 97.7°F | Resp 18 | Ht 67.5 in | Wt 145.5 lb

## 2020-02-28 DIAGNOSIS — Z803 Family history of malignant neoplasm of breast: Secondary | ICD-10-CM | POA: Insufficient documentation

## 2020-02-28 DIAGNOSIS — Z90710 Acquired absence of both cervix and uterus: Secondary | ICD-10-CM | POA: Insufficient documentation

## 2020-02-28 DIAGNOSIS — N801 Endometriosis of ovary: Secondary | ICD-10-CM

## 2020-02-28 DIAGNOSIS — N80109 Endometriosis of ovary, unspecified side, unspecified depth: Secondary | ICD-10-CM

## 2020-02-28 DIAGNOSIS — Z9189 Other specified personal risk factors, not elsewhere classified: Secondary | ICD-10-CM | POA: Diagnosis not present

## 2020-02-28 DIAGNOSIS — Z1231 Encounter for screening mammogram for malignant neoplasm of breast: Secondary | ICD-10-CM | POA: Diagnosis not present

## 2020-02-28 DIAGNOSIS — Z1239 Encounter for other screening for malignant neoplasm of breast: Secondary | ICD-10-CM

## 2020-02-28 DIAGNOSIS — I7300 Raynaud's syndrome without gangrene: Secondary | ICD-10-CM

## 2020-02-28 DIAGNOSIS — Z8616 Personal history of COVID-19: Secondary | ICD-10-CM | POA: Insufficient documentation

## 2020-02-28 DIAGNOSIS — Z809 Family history of malignant neoplasm, unspecified: Secondary | ICD-10-CM | POA: Diagnosis not present

## 2020-02-28 DIAGNOSIS — Z800 Family history of malignant neoplasm of digestive organs: Secondary | ICD-10-CM | POA: Diagnosis not present

## 2020-03-02 ENCOUNTER — Ambulatory Visit: Payer: BC Managed Care – PPO | Admitting: Oncology

## 2020-03-29 DIAGNOSIS — M9901 Segmental and somatic dysfunction of cervical region: Secondary | ICD-10-CM | POA: Diagnosis not present

## 2020-03-29 DIAGNOSIS — M5033 Other cervical disc degeneration, cervicothoracic region: Secondary | ICD-10-CM | POA: Diagnosis not present

## 2020-03-31 DIAGNOSIS — Z20828 Contact with and (suspected) exposure to other viral communicable diseases: Secondary | ICD-10-CM | POA: Diagnosis not present

## 2020-04-07 ENCOUNTER — Other Ambulatory Visit: Payer: Self-pay

## 2020-04-07 ENCOUNTER — Encounter: Payer: Self-pay | Admitting: Gastroenterology

## 2020-04-07 ENCOUNTER — Ambulatory Visit (INDEPENDENT_AMBULATORY_CARE_PROVIDER_SITE_OTHER): Payer: BC Managed Care – PPO | Admitting: Gastroenterology

## 2020-04-07 VITALS — BP 108/67 | HR 77 | Temp 97.7°F | Ht 68.0 in | Wt 147.4 lb

## 2020-04-07 DIAGNOSIS — K5900 Constipation, unspecified: Secondary | ICD-10-CM | POA: Diagnosis not present

## 2020-04-07 NOTE — Patient Instructions (Signed)
I have given samples of Motegrity to start once daily.  Please let us know how you are doing on this!  Will see you in 6 months!  I enjoyed seeing you again today! As you know, I value our relationship and want to provide genuine, compassionate, and quality care. I welcome your feedback. If you receive a survey regarding your visit,  I greatly appreciate you taking time to fill this out. See you next time!  Gelene Mink, PhD, ANP-BC [facility] Gastroenterology

## 2020-04-07 NOTE — Progress Notes (Signed)
Referring Provider: Richardean Chimera, MD Primary Care Physician:  Richardean Chimera, MD Primary GI: Dr. Marletta Lor   Chief Complaint  Patient presents with  . Constipation    HPI:   Cynthia Chase is a 40 y.o. female presenting today with a history of constipation, anal fissure, hemorrhoids, and last colonoscopy in 2017. Linzess caused cramping, Amitiza with nausea. Trulance without significant improvement.   Magnesium supplements, fiber pills, and will take a Fleet enema if needed. Can't remember if she took Motegrity or not. Has BM sometimes daily and sometimes every 3-4 days. Wants to try Motegrity.    Past Medical History:  Diagnosis Date  . Abnormal Pap smear    2006  . Anemia    With pregnancy  . Arthritis    Osteoarthritis; bilateral knees  . Headache    Migraines  . History of anal fissures    saw Dr Jonette Eva gastro for eval or rectal bleeding and lower abdominal pain, diagnostic colonoscopy 2017;  denies current abdomnal symptoms   . PONV (postoperative nausea and vomiting)    does well with scopolamine   . Raynaud disease   . Right ovarian cyst   . Shingles    3rd and 4th grade; denies flare-ups   . Varicella    as a child  . Wears glasses     Past Surgical History:  Procedure Laterality Date  . ABDOMINAL HYSTERECTOMY    . ADENOIDECTOMY     still has tonsils   . ARTHROSCOPIC REPAIR ACL Right 1998, 2003   Dr Eugenia Mcalpine   . ARTHROSCOPIC REPAIR ACL Left 2000   Dr Eugenia Mcalpine  . BILATERAL SALPINGECTOMY Bilateral 01/03/2015   Procedure: BILATERAL SALPINGECTOMY;  Surgeon: Hal Morales, MD;  Location: [facility];  Service: Gynecology;  Laterality: Bilateral;  . BUNIONECTOMY  2012   done by [facility] at [facility]), patient does not remember name of surgeon   . COLONOSCOPY N/A 08/04/2015   non-bleeding external and internal hemorrhoids, anal fissure  . DILATION AND CURETTAGE OF UTERUS  2008   [facility] [name] at [facility]   . KNEE SURGERY    . LAPAROSCOPIC ASSISTED VAGINAL HYSTERECTOMY N/A 01/03/2015   Procedure: LAPAROSCOPIC ASSISTED VAGINAL HYSTERECTOMY;  Surgeon: Hal Morales, MD;  Location: [facility];  Service: Gynecology;  Laterality: N/A;  . WISDOM TOOTH EXTRACTION      Current Outpatient Medications  Medication Sig Dispense Refill  . FIBER PO Take 2 tablets by mouth at bedtime.    . Lactobacillus (PROBIOTIC CHILDRENS) CHEW Chew by mouth daily.    Marland Kitchen MAGNESIUM PO Take 1 tablet by mouth daily.    . meloxicam (MOBIC) 15 MG tablet 15 mg as needed.     . polyethylene glycol (MIRALAX / GLYCOLAX) 17 g packet Take 17 g by mouth daily as needed.    . Sodium Phosphates (FLEET ENEMA RE) Place rectally as needed.    . Prucalopride Succinate (MOTEGRITY) 2 MG TABS Take 1 tablet (2 mg total) by mouth daily. 30 tablet 5   No current facility-administered medications for this visit.    Allergies as of 04/07/2020 - Review Complete 04/07/2020  Allergen Reaction Noted  . Gentamicin  07/27/2015  . Adhesive [tape]  05/27/2017  . Sulfa antibiotics Rash 11/16/2010    Family History  Problem Relation Age of Onset  . Hyperlipidemia Mother   . Cancer Mother   . Cancer Maternal Grandfather   . Colon polyps Father   .  Breast cancer Paternal Aunt   . Breast cancer Paternal Grandmother   . Breast cancer Cousin   . Colon cancer Neg Hx   . Ulcerative colitis Neg Hx   . Crohn's disease Neg Hx     Social History   Socioeconomic History  . Marital status: Married    Spouse name: Not on file  . Number of children: Not on file  . Years of education: Not on file  . Highest education level: Not on file  Occupational History  . Not on file  Tobacco Use  . Smoking status: Never Smoker  . Smokeless tobacco: Never Used  . Tobacco comment: Never smoked  Vaping Use  . Vaping Use: Never used  Substance and Sexual Activity  . Alcohol use: Yes    Alcohol/week: 0.0 standard drinks    Comment: occassional   . Drug use: No  . Sexual activity: Yes    Birth control/protection: Post-menopausal  Other Topics Concern  . Not on file  Social History Narrative   SELLS INSURANCE FOR A LIVING. 2 KIDS(4,7)-MARRIED. BORN AND RAISED IN [place]. WENT TO [employer] [place] AND PLAYED BASKETBALL.    Social Determinants of Health   Financial Resource Strain: Not on file  Food Insecurity: Not on file  Transportation Needs: Not on file  Physical Activity: Not on file  Stress: Not on file  Social Connections: Not on file    Review of Systems: Gen: Denies fever, chills, anorexia. Denies fatigue, weakness, weight loss.  CV: Denies chest pain, palpitations, syncope, peripheral edema, and claudication. Resp: Denies dyspnea at rest, cough, wheezing, coughing up blood, and pleurisy. GI: see HPI Derm: Denies rash, itching, dry skin Psych: Denies depression, anxiety, memory loss, confusion. No homicidal or suicidal ideation.  Heme: Denies bruising, bleeding, and enlarged lymph nodes.  Physical Exam: BP 108/67   Pulse 77   Temp 97.7 F (36.5 C) (Temporal)   Ht 5\' 8"  (1.727 m)   Wt 147 lb 6.4 oz (66.9 kg)   LMP 11/18/2014 Comment: partial   BMI 22.41 kg/m  General:   Alert and oriented. No distress noted. Pleasant and cooperative.  Head:  Normocephalic and atraumatic. Eyes:  Conjuctiva clear without scleral icterus. Mouth:  Mask in place Abdomen:  +BS, soft, non-tender and non-distended. No rebound or guarding. No HSM or masses noted. Msk:  Symmetrical without gross deformities. Normal posture. Extremities:  Without edema. Neurologic:  Alert and  oriented x4 Psych:  Alert and cooperative. Normal mood and affect.  ASSESSMENT: Cynthia Chase is a 40 y.o. female presenting today with history of chronic constipation with failure of Linzess, Amitiza, and Trulance. Will trial Motegrity 2 mg daily. Samples have been provided today, and she will call or message with an update.    PLAN:  Motegrity  2 mg daily Call with progress report 6 month return  30, PhD, [facility] Gastroenterology

## 2020-04-19 DIAGNOSIS — M5033 Other cervical disc degeneration, cervicothoracic region: Secondary | ICD-10-CM | POA: Diagnosis not present

## 2020-04-19 DIAGNOSIS — M9901 Segmental and somatic dysfunction of cervical region: Secondary | ICD-10-CM | POA: Diagnosis not present

## 2020-04-24 DIAGNOSIS — K219 Gastro-esophageal reflux disease without esophagitis: Secondary | ICD-10-CM | POA: Diagnosis not present

## 2020-04-24 DIAGNOSIS — J343 Hypertrophy of nasal turbinates: Secondary | ICD-10-CM | POA: Diagnosis not present

## 2020-04-24 DIAGNOSIS — J3489 Other specified disorders of nose and nasal sinuses: Secondary | ICD-10-CM | POA: Diagnosis not present

## 2020-04-24 DIAGNOSIS — J040 Acute laryngitis: Secondary | ICD-10-CM | POA: Diagnosis not present

## 2020-04-25 DIAGNOSIS — M9901 Segmental and somatic dysfunction of cervical region: Secondary | ICD-10-CM | POA: Diagnosis not present

## 2020-04-25 DIAGNOSIS — M5033 Other cervical disc degeneration, cervicothoracic region: Secondary | ICD-10-CM | POA: Diagnosis not present

## 2020-05-04 DIAGNOSIS — M9901 Segmental and somatic dysfunction of cervical region: Secondary | ICD-10-CM | POA: Diagnosis not present

## 2020-05-04 DIAGNOSIS — M5033 Other cervical disc degeneration, cervicothoracic region: Secondary | ICD-10-CM | POA: Diagnosis not present

## 2020-05-10 DIAGNOSIS — M9901 Segmental and somatic dysfunction of cervical region: Secondary | ICD-10-CM | POA: Diagnosis not present

## 2020-05-10 DIAGNOSIS — M5033 Other cervical disc degeneration, cervicothoracic region: Secondary | ICD-10-CM | POA: Diagnosis not present

## 2020-05-17 DIAGNOSIS — M5033 Other cervical disc degeneration, cervicothoracic region: Secondary | ICD-10-CM | POA: Diagnosis not present

## 2020-05-17 DIAGNOSIS — M9901 Segmental and somatic dysfunction of cervical region: Secondary | ICD-10-CM | POA: Diagnosis not present

## 2020-05-24 DIAGNOSIS — M9901 Segmental and somatic dysfunction of cervical region: Secondary | ICD-10-CM | POA: Diagnosis not present

## 2020-05-24 DIAGNOSIS — M5033 Other cervical disc degeneration, cervicothoracic region: Secondary | ICD-10-CM | POA: Diagnosis not present

## 2020-05-29 DIAGNOSIS — M9901 Segmental and somatic dysfunction of cervical region: Secondary | ICD-10-CM | POA: Diagnosis not present

## 2020-05-29 DIAGNOSIS — M5033 Other cervical disc degeneration, cervicothoracic region: Secondary | ICD-10-CM | POA: Diagnosis not present

## 2020-05-31 DIAGNOSIS — M9901 Segmental and somatic dysfunction of cervical region: Secondary | ICD-10-CM | POA: Diagnosis not present

## 2020-05-31 DIAGNOSIS — M5033 Other cervical disc degeneration, cervicothoracic region: Secondary | ICD-10-CM | POA: Diagnosis not present

## 2020-06-05 DIAGNOSIS — M5033 Other cervical disc degeneration, cervicothoracic region: Secondary | ICD-10-CM | POA: Diagnosis not present

## 2020-06-05 DIAGNOSIS — M9901 Segmental and somatic dysfunction of cervical region: Secondary | ICD-10-CM | POA: Diagnosis not present

## 2020-06-06 DIAGNOSIS — I7300 Raynaud's syndrome without gangrene: Secondary | ICD-10-CM | POA: Diagnosis not present

## 2020-06-06 DIAGNOSIS — L401 Generalized pustular psoriasis: Secondary | ICD-10-CM | POA: Diagnosis not present

## 2020-06-06 DIAGNOSIS — M2550 Pain in unspecified joint: Secondary | ICD-10-CM | POA: Diagnosis not present

## 2020-06-14 DIAGNOSIS — M5033 Other cervical disc degeneration, cervicothoracic region: Secondary | ICD-10-CM | POA: Diagnosis not present

## 2020-06-14 DIAGNOSIS — M9901 Segmental and somatic dysfunction of cervical region: Secondary | ICD-10-CM | POA: Diagnosis not present

## 2020-06-21 DIAGNOSIS — M5033 Other cervical disc degeneration, cervicothoracic region: Secondary | ICD-10-CM | POA: Diagnosis not present

## 2020-06-21 DIAGNOSIS — M9901 Segmental and somatic dysfunction of cervical region: Secondary | ICD-10-CM | POA: Diagnosis not present

## 2020-06-29 DIAGNOSIS — M5033 Other cervical disc degeneration, cervicothoracic region: Secondary | ICD-10-CM | POA: Diagnosis not present

## 2020-06-29 DIAGNOSIS — M9901 Segmental and somatic dysfunction of cervical region: Secondary | ICD-10-CM | POA: Diagnosis not present

## 2020-07-10 DIAGNOSIS — M9901 Segmental and somatic dysfunction of cervical region: Secondary | ICD-10-CM | POA: Diagnosis not present

## 2020-07-10 DIAGNOSIS — M5033 Other cervical disc degeneration, cervicothoracic region: Secondary | ICD-10-CM | POA: Diagnosis not present

## 2020-07-17 ENCOUNTER — Other Ambulatory Visit: Payer: Self-pay | Admitting: Oncology

## 2020-07-17 ENCOUNTER — Ambulatory Visit
Admission: RE | Admit: 2020-07-17 | Discharge: 2020-07-17 | Disposition: A | Discharge status: Home / Self Care | Payer: No Typology Code available for payment source | Source: Ambulatory Visit | Attending: Oncology | Admitting: Oncology

## 2020-07-17 DIAGNOSIS — N80109 Endometriosis of ovary, unspecified side, unspecified depth: Secondary | ICD-10-CM

## 2020-07-17 DIAGNOSIS — Z1239 Encounter for other screening for malignant neoplasm of breast: Secondary | ICD-10-CM | POA: Diagnosis not present

## 2020-07-17 DIAGNOSIS — I7300 Raynaud's syndrome without gangrene: Secondary | ICD-10-CM

## 2020-07-17 DIAGNOSIS — Z9189 Other specified personal risk factors, not elsewhere classified: Secondary | ICD-10-CM

## 2020-07-17 DIAGNOSIS — N801 Endometriosis of ovary: Secondary | ICD-10-CM

## 2020-07-17 MED ORDER — GADOBUTROL 1 MMOL/ML IV SOLN
6.0000 mL | Freq: Once | INTRAVENOUS | Status: AC | PRN
  Administered 2020-07-17: 6 mL via INTRAVENOUS

## 2020-07-17 NOTE — Progress Notes (Signed)
Breast MRI shows a possible finding in the right breast.  I have set this up for MRI biopsy

## 2020-07-19 ENCOUNTER — Other Ambulatory Visit: Payer: Self-pay | Admitting: Oncology

## 2020-07-19 DIAGNOSIS — M5033 Other cervical disc degeneration, cervicothoracic region: Secondary | ICD-10-CM | POA: Diagnosis not present

## 2020-07-19 DIAGNOSIS — Z1239 Encounter for other screening for malignant neoplasm of breast: Secondary | ICD-10-CM

## 2020-07-19 DIAGNOSIS — M9901 Segmental and somatic dysfunction of cervical region: Secondary | ICD-10-CM | POA: Diagnosis not present

## 2020-07-24 ENCOUNTER — Other Ambulatory Visit: Payer: Self-pay | Admitting: Oncology

## 2020-07-24 ENCOUNTER — Ambulatory Visit: Admission: RE | Admit: 2020-07-24 | Payer: BC Managed Care – PPO | Source: Ambulatory Visit

## 2020-07-24 ENCOUNTER — Other Ambulatory Visit: Payer: Self-pay

## 2020-07-24 ENCOUNTER — Ambulatory Visit
Admission: RE | Admit: 2020-07-24 | Discharge: 2020-07-24 | Disposition: A | Discharge status: Home / Self Care | Payer: BC Managed Care – PPO | Source: Ambulatory Visit | Attending: Oncology | Admitting: Oncology

## 2020-07-24 DIAGNOSIS — Z1239 Encounter for other screening for malignant neoplasm of breast: Secondary | ICD-10-CM

## 2020-07-24 DIAGNOSIS — N6312 Unspecified lump in the right breast, upper inner quadrant: Secondary | ICD-10-CM | POA: Diagnosis not present

## 2020-07-24 MED ORDER — GADOBENATE DIMEGLUMINE 529 MG/ML IV SOLN
13.0000 mL | Freq: Once | INTRAVENOUS | Status: AC | PRN
  Administered 2020-07-24: 13 mL via INTRAVENOUS

## 2020-07-25 ENCOUNTER — Telehealth: Payer: Self-pay | Admitting: Internal Medicine

## 2020-07-25 NOTE — Telephone Encounter (Signed)
PATIENT TRIED THE MOTEGRITY 2 MG SAMPLES AND THEY WOULD LIKE A PRESCRIPTION SENT TO  [facility] [place]

## 2020-07-25 NOTE — Telephone Encounter (Signed)
The pt tried and likes the motegrity samples and would like for you to send a Rx to [facility] [place]

## 2020-07-26 DIAGNOSIS — M9901 Segmental and somatic dysfunction of cervical region: Secondary | ICD-10-CM | POA: Diagnosis not present

## 2020-07-26 DIAGNOSIS — M5033 Other cervical disc degeneration, cervicothoracic region: Secondary | ICD-10-CM | POA: Diagnosis not present

## 2020-07-26 MED ORDER — MOTEGRITY 2 MG PO TABS: 1.0000 | ORAL_TABLET | Freq: Every day | ORAL | 5 refills | Status: DC

## 2020-07-26 NOTE — Addendum Note (Signed)
Addended by: Gelene Mink on: 07/26/2020 01:23 PM   Modules accepted: Orders

## 2020-07-26 NOTE — Telephone Encounter (Signed)
Completed prescription for Motegrity.

## 2020-07-31 DIAGNOSIS — M9901 Segmental and somatic dysfunction of cervical region: Secondary | ICD-10-CM | POA: Diagnosis not present

## 2020-07-31 DIAGNOSIS — M5033 Other cervical disc degeneration, cervicothoracic region: Secondary | ICD-10-CM | POA: Diagnosis not present

## 2020-08-09 DIAGNOSIS — M5033 Other cervical disc degeneration, cervicothoracic region: Secondary | ICD-10-CM | POA: Diagnosis not present

## 2020-08-09 DIAGNOSIS — M9901 Segmental and somatic dysfunction of cervical region: Secondary | ICD-10-CM | POA: Diagnosis not present

## 2020-08-15 DIAGNOSIS — M9901 Segmental and somatic dysfunction of cervical region: Secondary | ICD-10-CM | POA: Diagnosis not present

## 2020-08-15 DIAGNOSIS — M5033 Other cervical disc degeneration, cervicothoracic region: Secondary | ICD-10-CM | POA: Diagnosis not present

## 2020-08-23 DIAGNOSIS — M5033 Other cervical disc degeneration, cervicothoracic region: Secondary | ICD-10-CM | POA: Diagnosis not present

## 2020-08-23 DIAGNOSIS — M9901 Segmental and somatic dysfunction of cervical region: Secondary | ICD-10-CM | POA: Diagnosis not present

## 2020-08-30 DIAGNOSIS — M5033 Other cervical disc degeneration, cervicothoracic region: Secondary | ICD-10-CM | POA: Diagnosis not present

## 2020-08-30 DIAGNOSIS — M9901 Segmental and somatic dysfunction of cervical region: Secondary | ICD-10-CM | POA: Diagnosis not present

## 2020-09-14 DIAGNOSIS — M9901 Segmental and somatic dysfunction of cervical region: Secondary | ICD-10-CM | POA: Diagnosis not present

## 2020-09-14 DIAGNOSIS — M5033 Other cervical disc degeneration, cervicothoracic region: Secondary | ICD-10-CM | POA: Diagnosis not present

## 2020-09-28 DIAGNOSIS — M9901 Segmental and somatic dysfunction of cervical region: Secondary | ICD-10-CM | POA: Diagnosis not present

## 2020-09-28 DIAGNOSIS — M5033 Other cervical disc degeneration, cervicothoracic region: Secondary | ICD-10-CM | POA: Diagnosis not present

## 2020-10-05 ENCOUNTER — Ambulatory Visit: Payer: BC Managed Care – PPO | Admitting: Gastroenterology

## 2020-10-10 DIAGNOSIS — M9901 Segmental and somatic dysfunction of cervical region: Secondary | ICD-10-CM | POA: Diagnosis not present

## 2020-10-10 DIAGNOSIS — M5033 Other cervical disc degeneration, cervicothoracic region: Secondary | ICD-10-CM | POA: Diagnosis not present

## 2020-10-19 DIAGNOSIS — M9901 Segmental and somatic dysfunction of cervical region: Secondary | ICD-10-CM | POA: Diagnosis not present

## 2020-10-19 DIAGNOSIS — M5033 Other cervical disc degeneration, cervicothoracic region: Secondary | ICD-10-CM | POA: Diagnosis not present

## 2020-10-30 DIAGNOSIS — M5033 Other cervical disc degeneration, cervicothoracic region: Secondary | ICD-10-CM | POA: Diagnosis not present

## 2020-10-30 DIAGNOSIS — M9901 Segmental and somatic dysfunction of cervical region: Secondary | ICD-10-CM | POA: Diagnosis not present

## 2020-11-09 DIAGNOSIS — M9901 Segmental and somatic dysfunction of cervical region: Secondary | ICD-10-CM | POA: Diagnosis not present

## 2020-11-09 DIAGNOSIS — M5033 Other cervical disc degeneration, cervicothoracic region: Secondary | ICD-10-CM | POA: Diagnosis not present

## 2020-11-14 DIAGNOSIS — M9901 Segmental and somatic dysfunction of cervical region: Secondary | ICD-10-CM | POA: Diagnosis not present

## 2020-11-14 DIAGNOSIS — M5033 Other cervical disc degeneration, cervicothoracic region: Secondary | ICD-10-CM | POA: Diagnosis not present

## 2020-11-23 DIAGNOSIS — M9901 Segmental and somatic dysfunction of cervical region: Secondary | ICD-10-CM | POA: Diagnosis not present

## 2020-11-23 DIAGNOSIS — M5033 Other cervical disc degeneration, cervicothoracic region: Secondary | ICD-10-CM | POA: Diagnosis not present

## 2020-11-29 DIAGNOSIS — M5033 Other cervical disc degeneration, cervicothoracic region: Secondary | ICD-10-CM | POA: Diagnosis not present

## 2020-11-29 DIAGNOSIS — M9901 Segmental and somatic dysfunction of cervical region: Secondary | ICD-10-CM | POA: Diagnosis not present

## 2020-12-05 DIAGNOSIS — M5033 Other cervical disc degeneration, cervicothoracic region: Secondary | ICD-10-CM | POA: Diagnosis not present

## 2020-12-05 DIAGNOSIS — M9901 Segmental and somatic dysfunction of cervical region: Secondary | ICD-10-CM | POA: Diagnosis not present

## 2020-12-12 DIAGNOSIS — Z79899 Other long term (current) drug therapy: Secondary | ICD-10-CM | POA: Diagnosis not present

## 2020-12-12 DIAGNOSIS — L401 Generalized pustular psoriasis: Secondary | ICD-10-CM | POA: Diagnosis not present

## 2020-12-12 DIAGNOSIS — Z0000 Encounter for general adult medical examination without abnormal findings: Secondary | ICD-10-CM | POA: Diagnosis not present

## 2020-12-12 DIAGNOSIS — I7300 Raynaud's syndrome without gangrene: Secondary | ICD-10-CM | POA: Diagnosis not present

## 2020-12-12 DIAGNOSIS — M2550 Pain in unspecified joint: Secondary | ICD-10-CM | POA: Diagnosis not present

## 2020-12-13 DIAGNOSIS — M5033 Other cervical disc degeneration, cervicothoracic region: Secondary | ICD-10-CM | POA: Diagnosis not present

## 2020-12-13 DIAGNOSIS — M9901 Segmental and somatic dysfunction of cervical region: Secondary | ICD-10-CM | POA: Diagnosis not present

## 2020-12-19 DIAGNOSIS — M5033 Other cervical disc degeneration, cervicothoracic region: Secondary | ICD-10-CM | POA: Diagnosis not present

## 2020-12-19 DIAGNOSIS — M9901 Segmental and somatic dysfunction of cervical region: Secondary | ICD-10-CM | POA: Diagnosis not present

## 2020-12-19 DIAGNOSIS — Z0000 Encounter for general adult medical examination without abnormal findings: Secondary | ICD-10-CM | POA: Diagnosis not present

## 2020-12-19 DIAGNOSIS — I7300 Raynaud's syndrome without gangrene: Secondary | ICD-10-CM | POA: Diagnosis not present

## 2020-12-19 DIAGNOSIS — Z6823 Body mass index (BMI) 23.0-23.9, adult: Secondary | ICD-10-CM | POA: Diagnosis not present

## 2020-12-27 DIAGNOSIS — M9901 Segmental and somatic dysfunction of cervical region: Secondary | ICD-10-CM | POA: Diagnosis not present

## 2020-12-27 DIAGNOSIS — M5033 Other cervical disc degeneration, cervicothoracic region: Secondary | ICD-10-CM | POA: Diagnosis not present

## 2021-01-08 DIAGNOSIS — M9901 Segmental and somatic dysfunction of cervical region: Secondary | ICD-10-CM | POA: Diagnosis not present

## 2021-01-08 DIAGNOSIS — M5033 Other cervical disc degeneration, cervicothoracic region: Secondary | ICD-10-CM | POA: Diagnosis not present

## 2021-01-10 DIAGNOSIS — R0781 Pleurodynia: Secondary | ICD-10-CM | POA: Diagnosis not present

## 2021-01-15 DIAGNOSIS — R0781 Pleurodynia: Secondary | ICD-10-CM | POA: Diagnosis not present

## 2021-01-18 DIAGNOSIS — M5033 Other cervical disc degeneration, cervicothoracic region: Secondary | ICD-10-CM | POA: Diagnosis not present

## 2021-01-18 DIAGNOSIS — M9901 Segmental and somatic dysfunction of cervical region: Secondary | ICD-10-CM | POA: Diagnosis not present

## 2021-01-24 DIAGNOSIS — M9901 Segmental and somatic dysfunction of cervical region: Secondary | ICD-10-CM | POA: Diagnosis not present

## 2021-01-24 DIAGNOSIS — M5033 Other cervical disc degeneration, cervicothoracic region: Secondary | ICD-10-CM | POA: Diagnosis not present

## 2021-01-24 NOTE — Progress Notes (Signed)
[facility]  [phone] Fax:([phone]     ID: Cynthia Chase DOB: [birth year]  MR#: [id number]  MCN#:[id number]  Patient Care Team: Cynthia Chimera, MD as PCP - General (Family Medicine) Cynthia Bali, MD (Inactive) as Consulting Physician (Gastroenterology) Cynthia Libman, MD as Consulting Physician (Vascular Surgery) Cynthia Chase, Cynthia Hue, MD as Consulting Physician (Oncology) Cynthia Sox, MD as Consulting Physician (Radiology) Cynthia Dell, MD OTHER MD: Dr. Dierdre Chase (OBGYN)  CHIEF COMPLAINT: high risk for breast cancer  CURRENT TREATMENT: Intensified screening   INTERVAL HISTORY: Cynthia Chase returns today for follow up of her high risk for breast cancer. She continues under intensified screening.  Recall she was unable to tolerate tamoxifen.  Since her last visit, she underwent screening breast MRI on 07/17/2020 showing: breast composition C; 7 mm focal non-mass enhancement in upper-inner right breast; no evidence of malignancy in left.  She returned for MRI-guided biopsy of the non-mass enhancement on 07/24/2020. However, the enhancement was not seen, and the biopsy was canceled.  Her most recent screening mammogram was 01/10/2020.   REVIEW OF SYSTEMS: Cynthia Chase works out about twice a week and otherwise is normally active particularly as her children are into sports.  Recall she does not have periods because she had a hysterectomy but is clearly still premenopausal.  A detailed review of systems today was otherwise stable   COVID 19 VACCINATION STATUS: Had Covid September 2021.  Had Moderna x1 by October 2020  HISTORY OF CURRENT ILLNESS: From the original intake note:  Cynthia Chase has a family history of breast cancer, detailed below.  It was recommended she start mammography early, and she had her first screening mammography on 12/03/2018 showing a possible abnormality in the right breast. She underwent right  diagnostic mammography with tomography and ultrasonography at [facility] on 12/07/2018 showing: breast density category C; benign cyst within the right breast at the 10 o'clock axis, measuring 7 mm, corresponding to the mammographic finding.  She was referred to the high-risk clinic for further discussion of risk management.  Her subsequent history is as detailed below.   PAST MEDICAL HISTORY: Past Medical History:  Diagnosis Date   Abnormal Pap smear    2006   Anemia    With pregnancy   Arthritis    Osteoarthritis; bilateral knees   Headache    Migraines   History of anal fissures    saw Dr Cynthia Chase gastro for eval or rectal bleeding and lower abdominal pain, diagnostic colonoscopy 2017;  denies current abdomnal symptoms    PONV (postoperative nausea and vomiting)    does well with scopolamine    Raynaud disease    Right ovarian cyst    Shingles    3rd and 4th grade; denies flare-ups    Varicella    as a child   Wears glasses     PAST SURGICAL HISTORY: Past Surgical History:  Procedure Laterality Date   ABDOMINAL HYSTERECTOMY     ADENOIDECTOMY     still has tonsils    ARTHROSCOPIC REPAIR ACL Right 1998, 2003   Dr Cynthia Chase    ARTHROSCOPIC REPAIR ACL Left 2000   Dr Cynthia Chase   BILATERAL SALPINGECTOMY Bilateral 01/03/2015   Procedure: BILATERAL SALPINGECTOMY;  Surgeon: Cynthia Morales, MD;  Location: [facility];  Service: Gynecology;  Laterality: Bilateral;   BUNIONECTOMY  2012   done by [facility]( [facility]), patient does not remember name of surgeon  COLONOSCOPY N/A 08/04/2015   non-bleeding external and internal hemorrhoids, anal fissure   DILATION AND CURETTAGE OF UTERUS  2008   Dr Cynthia [facility] at [facility] N/A 01/03/2015   Procedure: LAPAROSCOPIC ASSISTED VAGINAL HYSTERECTOMY;  Surgeon: Cynthia Manges, MD;  Location: [facility];  Service: Gynecology;   Laterality: N/A;   WISDOM TOOTH EXTRACTION      FAMILY HISTORY: Family History  Problem Relation Age of Onset   Hyperlipidemia Mother    Cancer Mother    Cancer Maternal Grandfather    Colon polyps Father    Breast cancer Paternal Aunt    Breast cancer Paternal Grandmother    Breast cancer Cousin    Colon cancer Neg Hx    Ulcerative colitis Neg Hx    Crohn's disease Neg Hx   The patient's father is 53 year old as of October 2020.  He has 1 sister, who had breast cancer, unknown age of initial diagnosis.  That sister has a daughter, the patient's first cousin, also with breast cancer, diagnosed before the age of 51.  The patient's father had 1 brother who died in his 86s from lung cancer in the setting of tobacco abuse.  The patient's father's mother had breast cancer, age of diagnosis unknown.  The patient's father's father had pancreatic cancer and his nephew, the patient is father's brother son, also had pancreatic cancer.  On the maternal side the patient's mother is 51 years old as of October 2020.  The maternal grandfather had carcinoid.  There is no history of breast ovarian or pancreatic cancer on the maternal side of the family.  The patient herself has 1 brother, no sisters, with no history of cancer.     GYNECOLOGIC HISTORY:  Patient's last menstrual period was 11/18/2014. Menarche: 40 years old Age at first live birth: 40 years old Cynthia Chase 2 Contraception: She took oral contraceptives for many years with no clotting complications Hysterectomy? yes BSO? 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  Her husband Cynthia Chase teaches physical education at the [employer] [place] [employer].  Their children are Cynthia Chase, 34 years old and Cynthia Chase 38 years old as of October 2022.  [name]  just got a phone.  Cynthia Chase attends a [employer] locally    ADVANCED DIRECTIVES: In the absence of any documents to the contrary the patient's husband is her healthcare power of attorney   HEALTH MAINTENANCE: Social History   Tobacco Use   Smoking status: Never   Smokeless tobacco: Never   Tobacco comments:    Never smoked  Vaping Use   Vaping Use: Never used  Substance Use Topics   Alcohol use: Yes    Alcohol/week: 0.0 standard drinks    Comment: occassional   Drug use: No     Colonoscopy: 2018/Dr. fields  PAP: s/Chase hysterectomy (previous: 2015, atypical)  Bone density: Never   Allergies  Allergen Reactions   Gentamicin     Eye drop form, caused itchy red eyes    Adhesive [Tape]     Band aids; casue redness    Sulfa Antibiotics Rash    Current Outpatient Medications  Medication Sig Dispense Refill   amLODipine (NORVASC) 5 MG tablet Take 10 mg by mouth at bedtime.  FIBER PO Take 2 tablets by mouth at bedtime.     Lactobacillus (PROBIOTIC CHILDRENS) CHEW Chew by mouth daily.     MAGNESIUM PO Take 1 tablet by mouth daily.     meloxicam (MOBIC) 15 MG tablet 15 mg as needed.      methylphenidate 18 MG PO CR tablet Take 18 mg by mouth daily.     polyethylene glycol (MIRALAX / GLYCOLAX) 17 g packet Take 17 g by mouth daily as needed.     Prucalopride Succinate (MOTEGRITY) 2 MG TABS Take 1 tablet (2 mg total) by mouth daily. 30 tablet 5   Sodium Phosphates (FLEET ENEMA RE) Place rectally as needed.     No current facility-administered medications for this visit.    OBJECTIVE: white woman who appears well  Vitals:   01/25/21 0855  BP: 109/69  Pulse: 78  Resp: 16  Temp: 97.8 F (36.6 C)  SpO2: 100%      Body mass index is 23.01 kg/m.   Wt Readings from Last 3 Encounters:  01/25/21 151 lb 4.8 oz (68.6 kg)  04/07/20 147 lb 6.4 oz (66.9 kg)  02/28/20 145 lb 8 oz (66 kg)     ECOG FS:0 - Asymptomatic  Sclerae unicteric, EOMs intact Wearing a mask No  cervical or supraclavicular adenopathy Lungs no rales or rhonchi Heart regular rate and rhythm Abd soft, nontender, positive bowel sounds MSK no focal spinal tenderness, no upper extremity lymphedema Neuro: nonfocal, well oriented, appropriate affect Breasts: I do not palpate a mass in either breast.  There are no skin or nipple changes of concern.  Both axillae are benign.   LAB RESULTS:  CMP     Component Value Date/Time   NA 139 08/25/2019 1350   K 3.6 08/25/2019 1350   CL 101 08/25/2019 1350   CO2 29 08/25/2019 1350   GLUCOSE 87 08/25/2019 1350   BUN 14 08/25/2019 1350   CREATININE 0.83 08/25/2019 1350   CALCIUM 9.3 08/25/2019 1350   PROT 7.1 08/25/2019 1350   ALBUMIN 4.1 08/25/2019 1350   AST 15 08/25/2019 1350   ALT 9 08/25/2019 1350   ALKPHOS 57 08/25/2019 1350   BILITOT 0.6 08/25/2019 1350   GFRNONAA >60 08/25/2019 1350   GFRAA >60 08/25/2019 1350    No results found for: TOTALPROTELP, ALBUMINELP, A1GS, A2GS, BETS, BETA2SER, GAMS, MSPIKE, SPEI  No results found for: KPAFRELGTCHN, LAMBDASER, KAPLAMBRATIO  Lab Results  Component Value Date   WBC 6.3 08/25/2019   NEUTROABS 4.1 08/25/2019   HGB 13.0 08/25/2019   HCT 38.5 08/25/2019   MCV 96.7 08/25/2019   PLT 192 08/25/2019   No results found for: LABCA2  No components found for: [id number]  No results for input(s): INR in the last 168 hours.  No results found for: LABCA2  No results found for: [id number]  No results found for: [id number]  No results found for: [id number]  No results found for: CA2729  No components found for: HGQUANT  No results found for: CEA1 / No results found for: CEA1   No results found for: AFPTUMOR  No results found for: CHROMOGRNA  No results found for: HGBA, HGBA2QUANT, HGBFQUANT, HGBSQUAN (Hemoglobinopathy evaluation)   No results found for: LDH  No results found for: IRON, TIBC, IRONPCTSAT (Iron and TIBC)  No results found for: FERRITIN  Urinalysis    Component Value  Date/Time   COLORURINE YELLOW 08/13/2010 2025   APPEARANCEUR CLEAR 08/13/2010 2025   [place] <1.005 (L) 08/13/2010 2025  PHURINE 6.5 08/13/2010 2025   GLUCOSEU NEGATIVE 08/13/2010 2025   HGBUR LARGE (A) 08/13/2010 2025   BILIRUBINUR NEGATIVE 08/13/2010 2025   KETONESUR NEGATIVE 08/13/2010 2025   PROTEINUR NEGATIVE 08/13/2010 2025   UROBILINOGEN 0.2 08/13/2010 2025   NITRITE NEGATIVE 08/13/2010 2025   LEUKOCYTESUR NEGATIVE 08/13/2010 2025    STUDIES: No results found.    ELIGIBLE FOR AVAILABLE RESEARCH PROTOCOL: no  ASSESSMENT: 40 y.o. [place] woman with a significant family history for breast and pancreatic cancer and a predicted lifetime risk of breast cancer of 22.6%  (1) took tamoxifen tamoxifen for a few months in 2020-21 with poor tolerance  (2) on intensified screening: Breast density category C  (a) mammography alternating with breast MRI every 6 months  (b) continue intensified screening onto breast density B or less  (c) biannual physician breast exam   PLAN:  Zoà is a little behind on mammography and I went ahead and placed that order.  That means she will have her next breast MRI in May.  She will have then her next mammogram a year from now and she will see [place] after that test.  She understands that it will be very difficult to time the MRI exactly on the same time as her.  Since she has no uterus and she does not have any symptoms associated with her periods, which are basically occult.  Accordingly as sometimes her breast will be caught in a proliferative phase and at other times after what would have been at her.  And that is the reason that she will have more occasions like the ones last time where there is seems to be something on the MRI which "disappears" on repeat.  I encouraged her to continue her exercise program.  She knows to call for any other issue that may develop before the next visit  Total encounter time 20 minutes.Chauncey Cruel,  MD   01/25/2021 9:21 AM Medical Oncology and Hematology [facility] [place], [place] [address] Tel. [phone]    Fax. [phone]   This document serves as a record of services personally performed by Lurline Del, MD. It was created on his behalf by Wilburn Mylar, a trained medical scribe. The creation of this record is based on the scribe's personal observations and the provider's statements to them.   I, Lurline Del MD, have reviewed the above documentation for accuracy and completeness, and I agree with the above.   *Total Encounter Time as defined by the Centers for Medicare and Medicaid Services includes, in addition to the face-to-face time of a patient visit (documented in the note above) non-face-to-face time: obtaining and reviewing outside history, ordering and reviewing medications, tests or procedures, care coordination (communications with other health care professionals or caregivers) and documentation in the medical record.

## 2021-01-25 ENCOUNTER — Other Ambulatory Visit: Payer: Self-pay

## 2021-01-25 ENCOUNTER — Inpatient Hospital Stay: Payer: BC Managed Care – PPO | Attending: Oncology | Admitting: Oncology

## 2021-01-25 VITALS — BP 109/69 | HR 78 | Temp 97.8°F | Resp 16 | Ht 68.0 in | Wt 151.3 lb

## 2021-01-25 DIAGNOSIS — Z8616 Personal history of COVID-19: Secondary | ICD-10-CM | POA: Diagnosis not present

## 2021-01-25 DIAGNOSIS — Z803 Family history of malignant neoplasm of breast: Secondary | ICD-10-CM | POA: Diagnosis not present

## 2021-01-25 DIAGNOSIS — Z809 Family history of malignant neoplasm, unspecified: Secondary | ICD-10-CM | POA: Insufficient documentation

## 2021-01-25 DIAGNOSIS — Z90710 Acquired absence of both cervix and uterus: Secondary | ICD-10-CM | POA: Insufficient documentation

## 2021-01-25 DIAGNOSIS — Z1239 Encounter for other screening for malignant neoplasm of breast: Secondary | ICD-10-CM

## 2021-01-25 DIAGNOSIS — Z1231 Encounter for screening mammogram for malignant neoplasm of breast: Secondary | ICD-10-CM | POA: Insufficient documentation

## 2021-01-25 DIAGNOSIS — Z800 Family history of malignant neoplasm of digestive organs: Secondary | ICD-10-CM | POA: Insufficient documentation

## 2021-01-31 ENCOUNTER — Other Ambulatory Visit: Payer: Self-pay

## 2021-01-31 ENCOUNTER — Encounter: Payer: Self-pay | Admitting: Gastroenterology

## 2021-01-31 ENCOUNTER — Ambulatory Visit (INDEPENDENT_AMBULATORY_CARE_PROVIDER_SITE_OTHER): Payer: BC Managed Care – PPO | Admitting: Gastroenterology

## 2021-01-31 VITALS — BP 97/66 | HR 72 | Temp 97.1°F | Ht 68.0 in | Wt 151.6 lb

## 2021-01-31 DIAGNOSIS — M5033 Other cervical disc degeneration, cervicothoracic region: Secondary | ICD-10-CM | POA: Diagnosis not present

## 2021-01-31 DIAGNOSIS — K5900 Constipation, unspecified: Secondary | ICD-10-CM | POA: Diagnosis not present

## 2021-01-31 DIAGNOSIS — M9901 Segmental and somatic dysfunction of cervical region: Secondary | ICD-10-CM | POA: Diagnosis not present

## 2021-01-31 MED ORDER — MOTEGRITY 2 MG PO TABS: 1.0000 | ORAL_TABLET | Freq: Every day | ORAL | 3 refills | Status: DC

## 2021-01-31 NOTE — Patient Instructions (Signed)
I have refilled Motegrity for you.  Please call if any issues!  Have a wonderful Thanksgiving and Christmas. We will see you in 1 year!  I enjoyed seeing you again today! As you know, I value our relationship and want to provide genuine, compassionate, and quality care. I welcome your feedback. If you receive a survey regarding your visit,  I greatly appreciate you taking time to fill this out. See you next time!  Gelene Mink, PhD, ANP-BC [facility]

## 2021-01-31 NOTE — Progress Notes (Addendum)
Referring Provider: Richardean Chimera, MD Primary Care Physician:  Richardean Chimera, MD Primary GI: Dr. Marletta Lor   Chief Complaint  Patient presents with   Constipation    HPI:   Cynthia Chase is a 40 y.o. female presenting today with a history of chroni cconstipation, anal fissure, hemorrohids, and last colonoscopy in 2017.  Linzess caused cramping, Amitiza with nausea. Trulance without significant improvement.    She was placed on Motegrity at last visit. Doing well. Has to take a fleet enema once per month, which is much improved from the past. No overt GI bleeding. Takes motegrity at night with BM in the morning. Initially had cramping, but this is much improved after taking consistently and taking at night. No concerns today.        Past Medical History:  Diagnosis Date   Abnormal Pap smear    2006   Anemia    With pregnancy   Arthritis    Osteoarthritis; bilateral knees   Headache    Migraines   History of anal fissures    saw Dr Jonette Eva gastro for eval or rectal bleeding and lower abdominal pain, diagnostic colonoscopy 2017;  denies current abdomnal symptoms    PONV (postoperative nausea and vomiting)    does well with scopolamine    Raynaud disease    Right ovarian cyst    Shingles    3rd and 4th grade; denies flare-ups    Varicella    as a child   Wears glasses     Past Surgical History:  Procedure Laterality Date   ABDOMINAL HYSTERECTOMY     ADENOIDECTOMY     still has tonsils    ARTHROSCOPIC REPAIR ACL Right 1998, 2003   Dr Eugenia Mcalpine    ARTHROSCOPIC REPAIR ACL Left 2000   Dr Eugenia Mcalpine   BILATERAL SALPINGECTOMY Bilateral 01/03/2015   Procedure: BILATERAL SALPINGECTOMY;  Surgeon: Hal Morales, MD;  Location: [facility];  Service: Gynecology;  Laterality: Bilateral;   BUNIONECTOMY  2012   done by surgeon at [facility]( [facility] patient does not remember name of surgeon    COLONOSCOPY N/A 08/04/2015    non-bleeding external and internal hemorrhoids, anal fissure   DILATION AND CURETTAGE OF UTERUS  2008   Dr [name] at [facility]    KNEE SURGERY     LAPAROSCOPIC ASSISTED VAGINAL HYSTERECTOMY N/A 01/03/2015   Procedure: LAPAROSCOPIC ASSISTED VAGINAL HYSTERECTOMY;  Surgeon: Hal Morales, MD;  Location: [facility];  Service: Gynecology;  Laterality: N/A;   WISDOM TOOTH EXTRACTION      Current Outpatient Medications  Medication Sig Dispense Refill   amLODipine (NORVASC) 5 MG tablet Take 10 mg by mouth at bedtime.     Lactobacillus (PROBIOTIC CHILDRENS) CHEW Chew by mouth daily.     Sodium Phosphates (FLEET ENEMA RE) Place rectally as needed.     polyethylene glycol (MIRALAX / GLYCOLAX) 17 g packet Take 17 g by mouth daily as needed.     Prucalopride Succinate (MOTEGRITY) 2 MG TABS Take 1 tablet (2 mg total) by mouth daily. 90 tablet 3   No current facility-administered medications for this visit.    Allergies as of 01/31/2021 - Review Complete 01/31/2021  Allergen Reaction Noted   Gentamicin  07/27/2015   Adhesive [tape]  05/27/2017   Sulfa antibiotics Rash 11/16/2010    Family History  Problem Relation Age of Onset   Hyperlipidemia Mother    Cancer Mother    Cancer  Maternal Grandfather    Colon polyps Father    Breast cancer Paternal Aunt    Breast cancer Paternal Grandmother    Breast cancer Cousin    Colon cancer Neg Hx    Ulcerative colitis Neg Hx    Crohn's disease Neg Hx     Social History   Socioeconomic History   Marital status: Married    Spouse name: Not on file   Number of children: Not on file   Years of education: Not on file   Highest education level: Not on file  Occupational History   Not on file  Tobacco Use   Smoking status: Never   Smokeless tobacco: Never   Tobacco comments:    Never smoked  Vaping Use   Vaping Use: Never used  Substance and Sexual Activity   Alcohol use: Yes    Alcohol/week: 0.0 standard drinks    Comment:  occassional   Drug use: No   Sexual activity: Yes    Birth control/protection: Post-menopausal  Other Topics Concern   Not on file  Social History Narrative   SELLS INSURANCE FOR A LIVING. 2 KIDS(4,7)-MARRIED. BORN AND RAISED IN [place]. WENT TO [place] AND PLAYED BASKETBALL.    Social Determinants of Health   Financial Resource Strain: Not on file  Food Insecurity: Not on file  Transportation Needs: Not on file  Physical Activity: Not on file  Stress: Not on file  Social Connections: Not on file    Review of Systems: Gen: Denies fever, chills, anorexia. Denies fatigue, weakness, weight loss.  CV: Denies chest pain, palpitations, syncope, peripheral edema, and claudication. Resp: Denies dyspnea at rest, cough, wheezing, coughing up blood, and pleurisy. GI: see HPI Derm: Denies rash, itching, dry skin Psych: Denies depression, anxiety, memory loss, confusion. No homicidal or suicidal ideation.  Heme: Denies bruising, bleeding, and enlarged lymph nodes.  Physical Exam: BP 97/66   Pulse 72   Temp (!) 97.1 F (36.2 C)   Ht 5\' 8"  (1.727 m)   Wt 151 lb 9.6 oz (68.8 kg)   LMP 11/18/2014 Comment: partial   BMI 23.05 kg/m  General:   Alert and oriented. No distress noted. Pleasant and cooperative.  Head:  Normocephalic and atraumatic. Eyes:  Conjuctiva clear without scleral icterus. Mouth:  mask in place Abdomen:  +BS, soft, non-tender and non-distended. No rebound or guarding. No HSM or masses noted. Msk:  Symmetrical without gross deformities. Normal posture. Extremities:  Without edema. Neurologic:  Alert and  oriented x4 Psych:  Alert and cooperative. Normal mood and affect.  ASSESSMENT/PLAN: Cynthia Chase is a 40 y.o. female presenting today with history of constipation. She has failed multiple prior agents as noted above but has had great response to Motegrity daily. No concerning signs/symptoms.  I have refilled Motegrity today with 90 day supply  and 3 refills. We will see her back in 1 year or sooner if needed.    Annitta Needs, PhD, ANP-BC [facility]

## 2021-02-07 DIAGNOSIS — M5033 Other cervical disc degeneration, cervicothoracic region: Secondary | ICD-10-CM | POA: Diagnosis not present

## 2021-02-07 DIAGNOSIS — M9901 Segmental and somatic dysfunction of cervical region: Secondary | ICD-10-CM | POA: Diagnosis not present

## 2021-02-12 DIAGNOSIS — M9901 Segmental and somatic dysfunction of cervical region: Secondary | ICD-10-CM | POA: Diagnosis not present

## 2021-02-12 DIAGNOSIS — M5033 Other cervical disc degeneration, cervicothoracic region: Secondary | ICD-10-CM | POA: Diagnosis not present

## 2021-02-21 DIAGNOSIS — M5033 Other cervical disc degeneration, cervicothoracic region: Secondary | ICD-10-CM | POA: Diagnosis not present

## 2021-02-21 DIAGNOSIS — M9901 Segmental and somatic dysfunction of cervical region: Secondary | ICD-10-CM | POA: Diagnosis not present

## 2021-03-07 DIAGNOSIS — M5033 Other cervical disc degeneration, cervicothoracic region: Secondary | ICD-10-CM | POA: Diagnosis not present

## 2021-03-07 DIAGNOSIS — M9901 Segmental and somatic dysfunction of cervical region: Secondary | ICD-10-CM | POA: Diagnosis not present

## 2021-03-08 ENCOUNTER — Ambulatory Visit: Payer: BC Managed Care – PPO

## 2021-03-22 DIAGNOSIS — M9901 Segmental and somatic dysfunction of cervical region: Secondary | ICD-10-CM | POA: Diagnosis not present

## 2021-03-22 DIAGNOSIS — M5033 Other cervical disc degeneration, cervicothoracic region: Secondary | ICD-10-CM | POA: Diagnosis not present

## 2021-04-01 ENCOUNTER — Other Ambulatory Visit: Payer: Self-pay | Admitting: Gastroenterology

## 2021-04-02 NOTE — Telephone Encounter (Signed)
Dena: looks like the pharmacy is saying Motegrity may not be covered? She has tried/failed multiple agents and Motegrity has worked best. Can we find out what is going on?

## 2021-04-02 NOTE — Telephone Encounter (Signed)
PA done on Cover My Meds. DX: chronic constipation, and anal fissure. Tried and failed: Linzess (caused cramping), Amitiza (caused nausea) and Trulance (no significant improvement). Waiting on a response.

## 2021-04-03 ENCOUNTER — Telehealth: Payer: Self-pay

## 2021-04-03 DIAGNOSIS — Z01419 Encounter for gynecological examination (general) (routine) without abnormal findings: Secondary | ICD-10-CM | POA: Diagnosis not present

## 2021-04-03 NOTE — Telephone Encounter (Signed)
Spoke with pt this morning, she had a different insurance for over a year. We have on file her old insurance. She will call back later and get the fax so I can do a PA on her Motegrity.

## 2021-04-03 NOTE — Telephone Encounter (Signed)
Attempted to do a PA on pt with Cover My Meds for Motegrity, PA stopped due to pt's insurance.  Phoned the pt but vm not set up.  Pt returned call and I asked her regarding her insurance and the pt has a different insurance for the past year. I advised her that we do not have it in our system. Once the pt is at work she will call me to get fax number from [place]. I advised If insurance changes at any other time to please advise [place]. Pt expressed understanding. Waiting to re-do PA with pt's updated insurance.

## 2021-04-04 NOTE — Telephone Encounter (Signed)
Pt sent (faxed) her insurance card to [place] but the pt still has the same thing and it does not have Rx benefits.  Attempted to LMOVM for the pt but her vm was full. Will try her work number [phone]). Phoned the pt's work number and was transferred to her extension and the phone still just rang and that vm was full and could not leave a message there either.

## 2021-04-05 NOTE — Telephone Encounter (Signed)
Phoned the pt and advised once again that she does not have Rx coverage on her insurance. Pt to fax all of her cards for me to see. Waiting on her cards

## 2021-04-05 NOTE — Telephone Encounter (Signed)
Encounter Date:  04/03/2021   Signed            Pt sent (faxed) her insurance card to [place] but the pt still has the same thing and it does not have Rx benefits.  Attempted to LMOVM for the pt but her vm was full. Will try her work number [phone]). Phoned the pt's work number and was transferred to her extension and the phone still just rang and that vm was full and could not leave a message there either.        Electronically signed by Westley Foots I, CMA at 04/04/2021 11:30 AM

## 2021-04-06 NOTE — Telephone Encounter (Signed)
Tried again, hopefully this time it will go through

## 2021-04-09 NOTE — Telephone Encounter (Signed)
Phoned and LMOVM of the pharmacy to check pt's insurance to see if they have what we have. According to what we have she has no Rx benefits with this insurance

## 2021-04-10 NOTE — Telephone Encounter (Signed)
Phoned and LMOVM for the pt to call

## 2021-04-11 ENCOUNTER — Ambulatory Visit
Admission: RE | Admit: 2021-04-11 | Discharge: 2021-04-11 | Disposition: A | Discharge status: Home / Self Care | Payer: BC Managed Care – PPO | Source: Ambulatory Visit | Attending: Oncology | Admitting: Oncology

## 2021-04-11 ENCOUNTER — Other Ambulatory Visit: Payer: Self-pay

## 2021-04-11 DIAGNOSIS — Z1231 Encounter for screening mammogram for malignant neoplasm of breast: Secondary | ICD-10-CM | POA: Diagnosis not present

## 2021-04-11 DIAGNOSIS — Z1239 Encounter for other screening for malignant neoplasm of breast: Secondary | ICD-10-CM

## 2021-04-12 NOTE — Telephone Encounter (Signed)
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 She has no Rx coverage as I thought. So I have tried to contact this pt several times. Her vm at  work and cell was full. Then I was able to leave a message and she has yet to call us back. Please advise

## 2021-04-23 NOTE — Telephone Encounter (Signed)
Cynthia Chase, FYI:  Phoned the pt's cell phone today and her vm was still full.  As you can see I've tried to contact her and the pt's BCBS COMM does not have Rx coverage.

## 2021-04-25 NOTE — Telephone Encounter (Signed)
I checked the pt's chart because I've tried to contact her several times and no communication but on 1/10 at [facility] there is a Rx on file for her for Lubiprostone 24 mcg. The only thing left to do is send her a letter. Please advise.

## 2021-04-26 DIAGNOSIS — Z13820 Encounter for screening for osteoporosis: Secondary | ICD-10-CM | POA: Diagnosis not present

## 2021-04-27 NOTE — Telephone Encounter (Signed)
You've tried to call multiple times, so we can just send a letter to call us.

## 2021-04-30 ENCOUNTER — Other Ambulatory Visit: Payer: Self-pay | Admitting: Gastroenterology

## 2021-04-30 MED ORDER — TRULANCE 3 MG PO TABS: 1.0000 | ORAL_TABLET | Freq: Every day | ORAL | 3 refills | Status: DC

## 2021-04-30 NOTE — Telephone Encounter (Signed)
Letter mailed today.

## 2021-06-12 DIAGNOSIS — Z0000 Encounter for general adult medical examination without abnormal findings: Secondary | ICD-10-CM | POA: Diagnosis not present

## 2021-06-12 DIAGNOSIS — I7300 Raynaud's syndrome without gangrene: Secondary | ICD-10-CM | POA: Diagnosis not present

## 2021-06-12 DIAGNOSIS — M2550 Pain in unspecified joint: Secondary | ICD-10-CM | POA: Diagnosis not present

## 2021-06-12 DIAGNOSIS — L401 Generalized pustular psoriasis: Secondary | ICD-10-CM | POA: Diagnosis not present

## 2021-06-29 DIAGNOSIS — M25571 Pain in right ankle and joints of right foot: Secondary | ICD-10-CM | POA: Diagnosis not present

## 2021-07-06 DIAGNOSIS — M170 Bilateral primary osteoarthritis of knee: Secondary | ICD-10-CM | POA: Diagnosis not present

## 2021-07-06 DIAGNOSIS — M25571 Pain in right ankle and joints of right foot: Secondary | ICD-10-CM | POA: Diagnosis not present

## 2021-07-12 DIAGNOSIS — M6281 Muscle weakness (generalized): Secondary | ICD-10-CM | POA: Diagnosis not present

## 2021-07-12 DIAGNOSIS — M25571 Pain in right ankle and joints of right foot: Secondary | ICD-10-CM | POA: Diagnosis not present

## 2021-07-12 DIAGNOSIS — R2689 Other abnormalities of gait and mobility: Secondary | ICD-10-CM | POA: Diagnosis not present

## 2021-07-16 DIAGNOSIS — R2689 Other abnormalities of gait and mobility: Secondary | ICD-10-CM | POA: Diagnosis not present

## 2021-07-16 DIAGNOSIS — M6281 Muscle weakness (generalized): Secondary | ICD-10-CM | POA: Diagnosis not present

## 2021-07-16 DIAGNOSIS — M25571 Pain in right ankle and joints of right foot: Secondary | ICD-10-CM | POA: Diagnosis not present

## 2021-07-23 DIAGNOSIS — M25571 Pain in right ankle and joints of right foot: Secondary | ICD-10-CM | POA: Diagnosis not present

## 2021-07-23 DIAGNOSIS — R2689 Other abnormalities of gait and mobility: Secondary | ICD-10-CM | POA: Diagnosis not present

## 2021-07-23 DIAGNOSIS — M6281 Muscle weakness (generalized): Secondary | ICD-10-CM | POA: Diagnosis not present

## 2021-08-08 DIAGNOSIS — R2689 Other abnormalities of gait and mobility: Secondary | ICD-10-CM | POA: Diagnosis not present

## 2021-08-08 DIAGNOSIS — M6281 Muscle weakness (generalized): Secondary | ICD-10-CM | POA: Diagnosis not present

## 2021-08-08 DIAGNOSIS — M25571 Pain in right ankle and joints of right foot: Secondary | ICD-10-CM | POA: Diagnosis not present

## 2021-12-13 DIAGNOSIS — M2550 Pain in unspecified joint: Secondary | ICD-10-CM | POA: Diagnosis not present

## 2021-12-13 DIAGNOSIS — I7300 Raynaud's syndrome without gangrene: Secondary | ICD-10-CM | POA: Diagnosis not present

## 2021-12-13 DIAGNOSIS — L401 Generalized pustular psoriasis: Secondary | ICD-10-CM | POA: Diagnosis not present

## 2022-01-22 ENCOUNTER — Encounter: Payer: Self-pay | Admitting: *Deleted

## 2022-02-02 IMAGING — MG DIGITAL SCREENING BILAT W/ TOMO W/ CAD
8 series · 9 of 24 positions shown · non-contrast
Comparison: Previous exam(s).

CLINICAL DATA: Screening. History of benign MRI-guided biopsies of
the LEFT breast.

EXAM:
DIGITAL SCREENING BILATERAL MAMMOGRAM WITH TOMO AND CAD

[R MLO synth-2D]
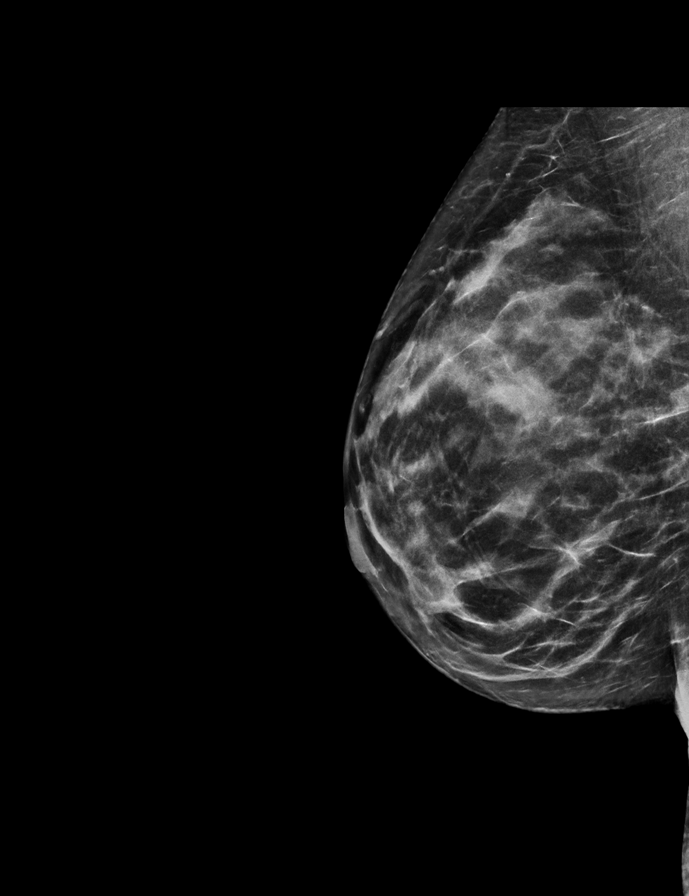

[L CC synth-2D]
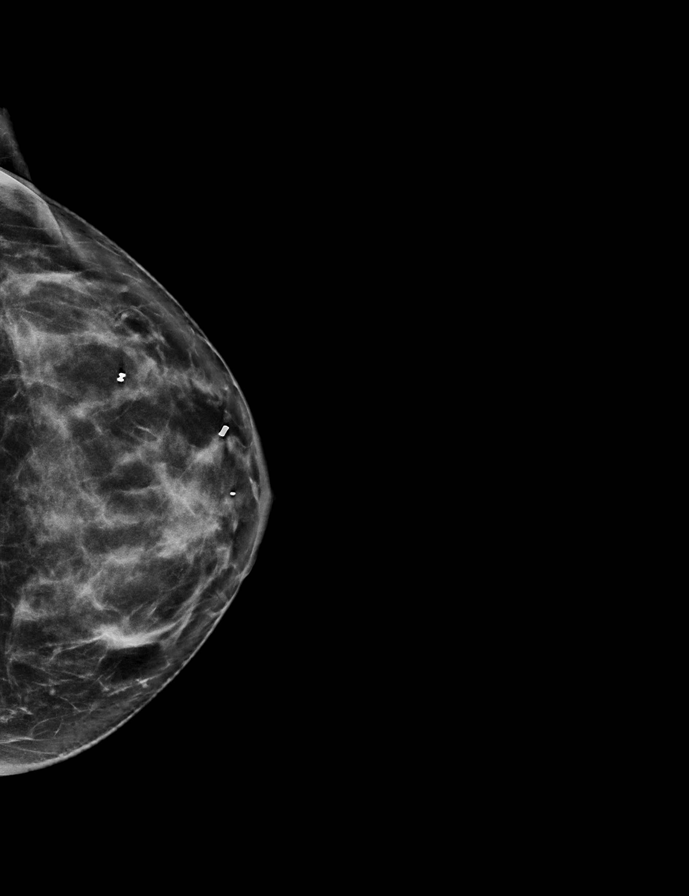

[R CC synth-2D]
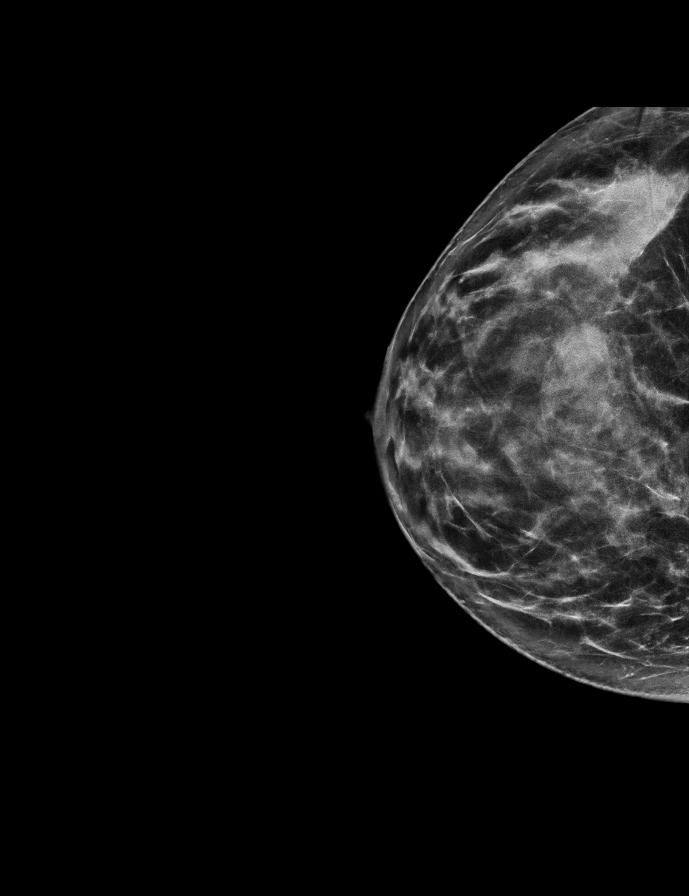

[L MLO synth-2D]
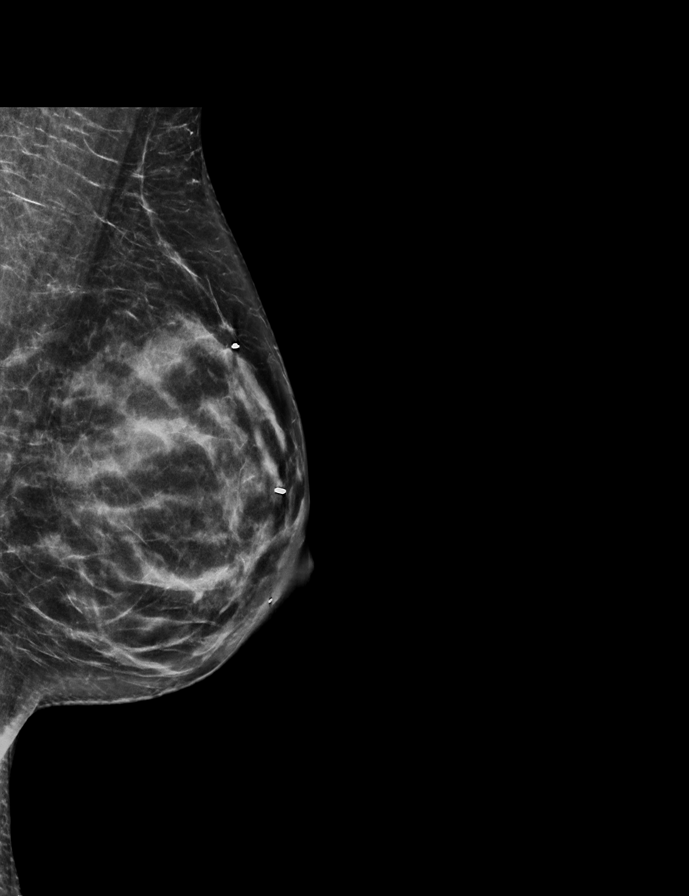

[L CC tomo · 2 of 65 frames shown]
[frame 21/65]
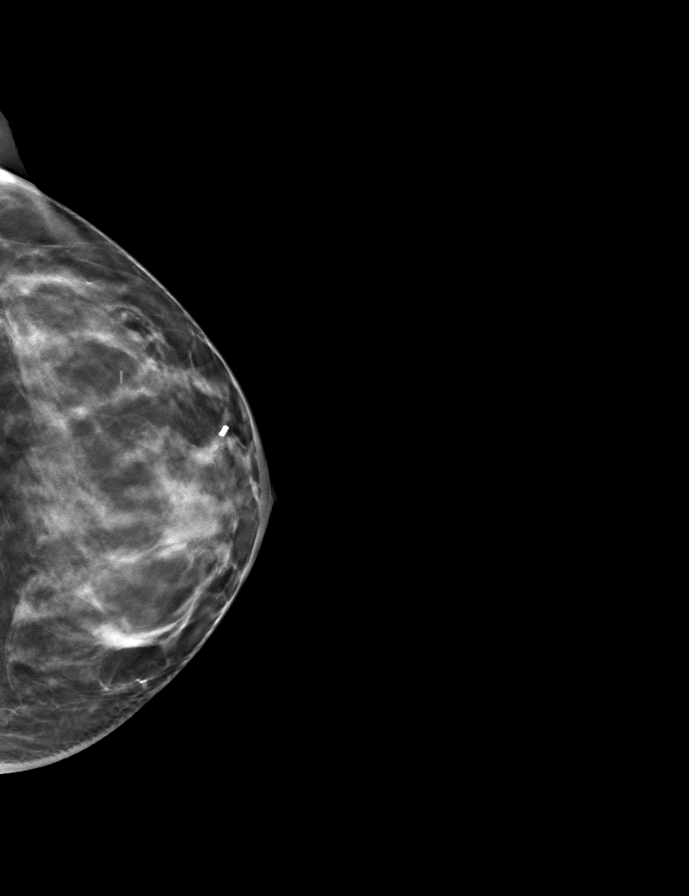
[frame 33/65]
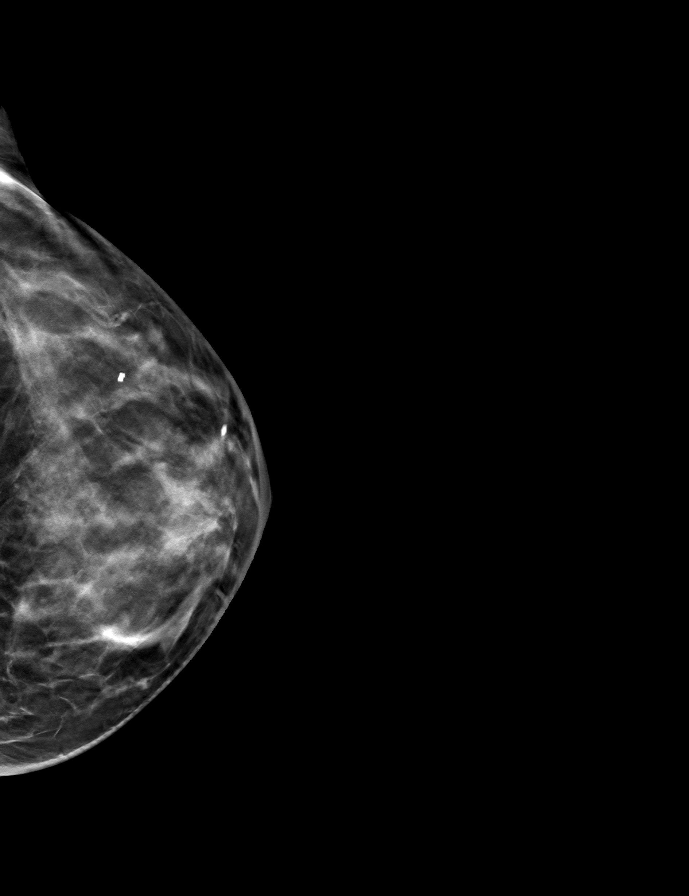

[R CC tomo · tomo slice 32/63.0]
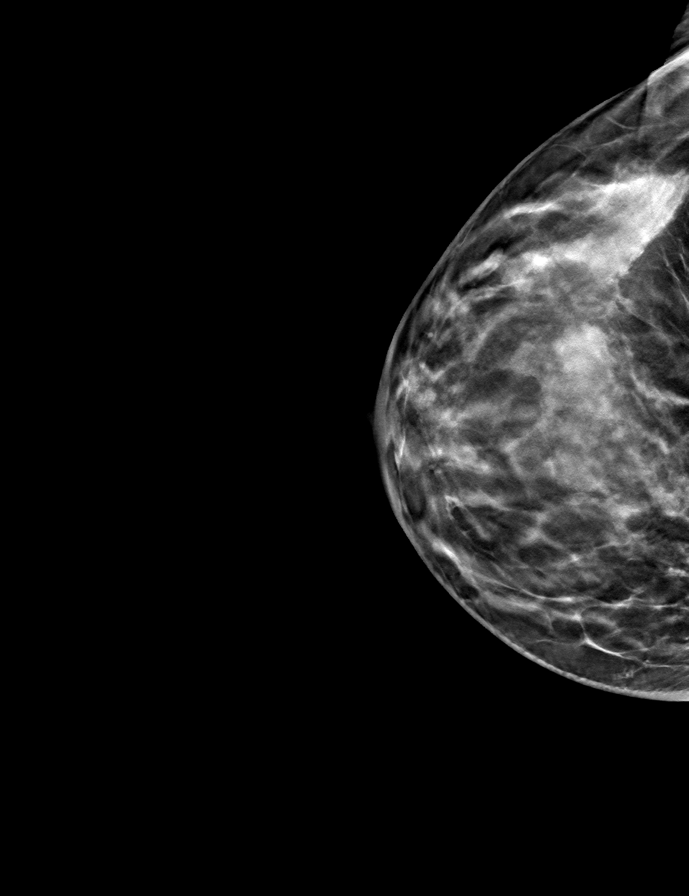

[L MLO tomo · tomo slice 31/60.0]
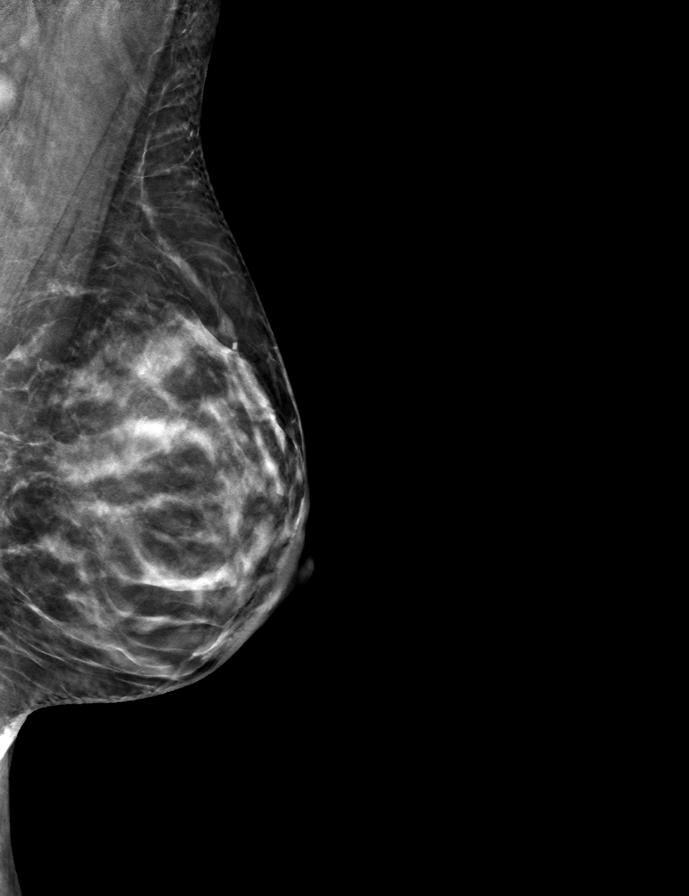

[R MLO tomo · tomo slice 31/61.0]
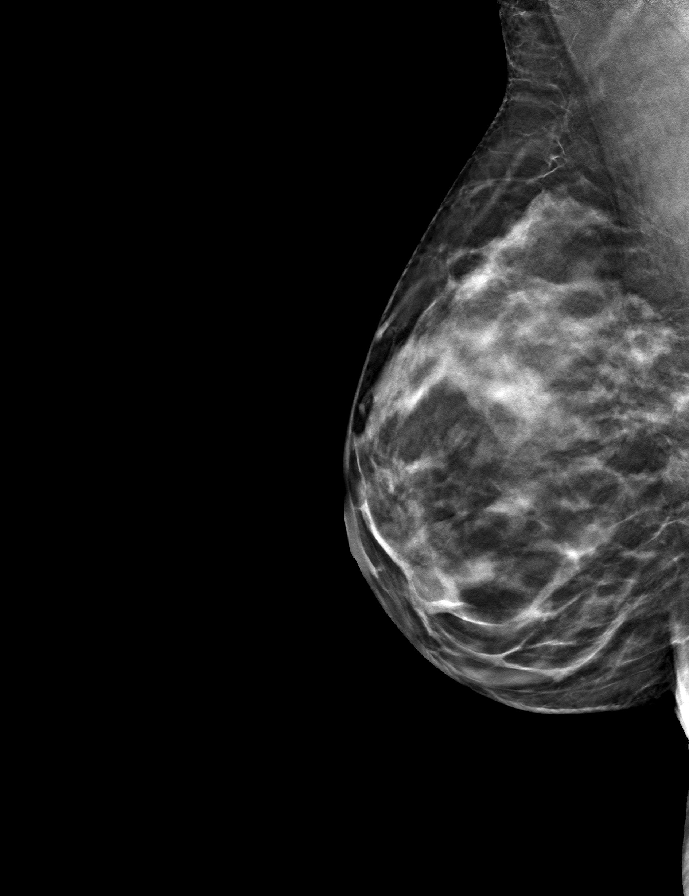

[9 of 24 positions shown; findings below may reference images not displayed]

ACR Breast Density Category c: The breast tissue is heterogeneously
dense, which may obscure small masses.
FINDINGS: There are no findings suspicious for malignancy. Images were
processed with CAD.
IMPRESSION: No mammographic evidence of malignancy. A result letter of this
screening mammogram will be mailed directly to the patient.

RECOMMENDATION:
Screening mammogram at age 42. (Code:[id number])

BI-RADS CATEGORY  1: Negative.

## 2022-02-05 ENCOUNTER — Other Ambulatory Visit: Payer: Self-pay

## 2022-02-05 ENCOUNTER — Inpatient Hospital Stay: Payer: BC Managed Care – PPO | Attending: Hematology and Oncology | Admitting: Hematology and Oncology

## 2022-02-05 ENCOUNTER — Encounter: Payer: Self-pay | Admitting: Hematology and Oncology

## 2022-02-05 VITALS — BP 111/63 | HR 68 | Temp 97.9°F | Resp 16 | Ht 68.0 in | Wt 153.4 lb

## 2022-02-05 DIAGNOSIS — Z9189 Other specified personal risk factors, not elsewhere classified: Secondary | ICD-10-CM | POA: Diagnosis not present

## 2022-02-05 DIAGNOSIS — Z803 Family history of malignant neoplasm of breast: Secondary | ICD-10-CM | POA: Insufficient documentation

## 2022-02-05 DIAGNOSIS — Z801 Family history of malignant neoplasm of trachea, bronchus and lung: Secondary | ICD-10-CM | POA: Insufficient documentation

## 2022-02-05 DIAGNOSIS — R92331 Mammographic heterogeneous density, right breast: Secondary | ICD-10-CM | POA: Diagnosis not present

## 2022-02-05 DIAGNOSIS — Z1239 Encounter for other screening for malignant neoplasm of breast: Secondary | ICD-10-CM | POA: Diagnosis not present

## 2022-02-05 NOTE — Progress Notes (Signed)
[facility]  Telephone:([phone]) [phone] Fax:(336) [phone]     ID: Cynthia Chase DOB: [birth year]  MR#: [id number]  WEX#:[id number]  Patient Care Team: Richardean Chimera, MD as PCP - General (Family Medicine) West Bali, MD (Inactive) as Consulting Physician (Gastroenterology) Nada Libman, MD as Consulting Physician (Vascular Surgery) Magrinat, Valentino Hue, MD (Inactive) as Consulting Physician (Oncology) Corky Sox, MD as Consulting Physician (Radiology) Rachel Moulds, MD OTHER MD: Dr. Dierdre Forth (OBGYN)  CHIEF COMPLAINT: high risk for breast cancer  CURRENT TREATMENT: Intensified screening  INTERVAL HISTORY: Cynthia Chase returns today for follow up of her high risk for breast cancer. She continues under intensified screening.  Recall she was unable to tolerate tamoxifen.  Since her last visit she underwent mammogram in January 2023, breast category density C, no findings suspicious for malignancy.  She says she fell off track with MRI breasts. She denies any breast changes recently No change in breathing, bowel habits or urinary habits.  Rest of the pertinent 10 point ROS reviewed and negative    COVID 19 VACCINATION STATUS: Had Covid September 2021.  Had Moderna x1 by October 2020  HISTORY OF CURRENT ILLNESS: From the original intake note:  Cynthia Chase has a family history of breast cancer, detailed below.  It was recommended she start mammography early, and she had her first screening mammography on 12/03/2018 showing a possible abnormality in the right breast. She underwent right diagnostic mammography with tomography and ultrasonography at [facility] on 12/07/2018 showing: breast density category C; benign cyst within the right breast at the 10 o'clock axis, measuring 7 mm, corresponding to the mammographic finding.  She was referred to the high-risk clinic for further discussion of risk management.  Her subsequent history is as  detailed below.   PAST MEDICAL HISTORY: Past Medical History:  Diagnosis Date   Abnormal Pap smear    2006   Anemia    With pregnancy   Arthritis    Osteoarthritis; bilateral knees   Headache    Migraines   History of anal fissures    saw Dr Jonette Eva gastro for eval or rectal bleeding and lower abdominal pain, diagnostic colonoscopy 2017;  denies current abdomnal symptoms    PONV (postoperative nausea and vomiting)    does well with scopolamine    Raynaud disease    Right ovarian cyst    Shingles    3rd and 4th grade; denies flare-ups    Varicella    as a child   Wears glasses     PAST SURGICAL HISTORY: Past Surgical History:  Procedure Laterality Date   ABDOMINAL HYSTERECTOMY     ADENOIDECTOMY     still has tonsils    ARTHROSCOPIC REPAIR ACL Right 1998, 2003   Dr Eugenia Mcalpine    ARTHROSCOPIC REPAIR ACL Left 2000   Dr Eugenia Mcalpine   BILATERAL SALPINGECTOMY Bilateral 01/03/2015   Procedure: BILATERAL SALPINGECTOMY;  Surgeon: Hal Morales, MD;  Location: [facility];  Service: Gynecology;  Laterality: Bilateral;   BUNIONECTOMY  2012   done by surgeon at [facility]( [facility]), patient does not remember name of surgeon    COLONOSCOPY N/A 08/04/2015   non-bleeding external and internal hemorrhoids, anal fissure   DILATION AND CURETTAGE OF UTERUS  2008   Dr [name] at [facility]    KNEE SURGERY     LAPAROSCOPIC ASSISTED VAGINAL HYSTERECTOMY N/A 01/03/2015   Procedure: LAPAROSCOPIC ASSISTED VAGINAL HYSTERECTOMY;  Surgeon: Hal Morales,  MD;  Location: [facility];  Service: Gynecology;  Laterality: N/A;   WISDOM TOOTH EXTRACTION      FAMILY HISTORY: Family History  Problem Relation Age of Onset   Hyperlipidemia Mother    Cancer Mother    Cancer Maternal Grandfather    Colon polyps Father    Breast cancer Paternal Aunt    Breast cancer Paternal Grandmother    Breast cancer Cousin    Colon cancer Neg Hx    Ulcerative colitis Neg Hx     Crohn's disease Neg Hx   The patient's father is 39 year old as of October 2020.  He has 1 sister, who had breast cancer, unknown age of initial diagnosis.  That sister has a daughter, the patient's first cousin, also with breast cancer, diagnosed before the age of 10545.  The patient's father had 1 brother who died in his 7050s from lung cancer in the setting of tobacco abuse.  The patient's father's mother had breast cancer, age of diagnosis unknown.  The patient's father's father had pancreatic cancer and his nephew, the patient is father's brother son, also had pancreatic cancer.  On the maternal side the patient's mother is 39 years old as of October 2020.  The maternal grandfather had carcinoid.  There is no history of breast ovarian or pancreatic cancer on the maternal side of the family.  The patient herself has 1 brother, no sisters, with no history of cancer.     GYNECOLOGIC HISTORY:  Patient's last menstrual period was 11/18/2014. Menarche: 41 years old Age at first live birth: 41 years old GX P 2 Contraception: She took oral contraceptives for many years with no clotting complications Hysterectomy? yes BSO? 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  Her husband Jill AlexandersJustin teaches physical education at the [employer].  Their children are Kara Meadmma, 39 years old and Hunter 39 years old as of October 2022.  Durene CalHunter just got a phone.  Santina EvansCatherine attends a [employer] locally    ADVANCED DIRECTIVES: In the absence of any documents to the contrary the patient's husband is her healthcare power of attorney   HEALTH MAINTENANCE: Social History   Tobacco Use   Smoking status: Never   Smokeless tobacco: Never   Tobacco comments:    Never smoked  Vaping Use   Vaping Use:  Never used  Substance Use Topics   Alcohol use: Yes    Alcohol/week: 0.0 standard drinks of alcohol    Comment: occassional   Drug use: No     Colonoscopy: 2018/Dr. fields  PAP: s/p hysterectomy (previous: 2015, atypical)  Bone density: Never   Allergies  Allergen Reactions   Gentamicin     Eye drop form, caused itchy red eyes    Adhesive [Tape]     Band aids; casue redness    Sulfa Antibiotics Rash    Current Outpatient Medications  Medication Sig Dispense Refill   amLODipine (NORVASC) 5 MG tablet Take 10 mg by mouth at bedtime.     Lactobacillus (PROBIOTIC CHILDRENS) CHEW Chew by mouth daily.     Plecanatide (TRULANCE) 3 MG TABS Take 1 tablet by mouth daily. 90 tablet 3   polyethylene glycol (MIRALAX / GLYCOLAX) 17 g packet Take 17 g by mouth daily as needed.     Sodium Phosphates (  FLEET ENEMA RE) Place rectally as needed.     No current facility-administered medications for this visit.    OBJECTIVE: white woman who appears well  There were no vitals filed for this visit.     There is no height or weight on file to calculate BMI.   Wt Readings from Last 3 Encounters:  01/31/21 151 lb 9.6 oz (68.8 kg)  01/25/21 151 lb 4.8 oz (68.6 kg)  04/07/20 147 lb 6.4 oz (66.9 kg)     ECOG FS:0 - Asymptomatic  Physical Exam Constitutional:      Appearance: Normal appearance.  Chest:     Comments: Bilateral breasts inspected.  No palpable masses or regional adenopathy Musculoskeletal:        General: No swelling or tenderness.     Cervical back: Normal range of motion and neck supple. No rigidity.  Lymphadenopathy:     Cervical: No cervical adenopathy.  Skin:    Coloration: Skin is not jaundiced.  Neurological:     Mental Status: She is alert.     LAB RESULTS:  CMP     Component Value Date/Time   NA 139 08/25/2019 1350   K 3.6 08/25/2019 1350   CL 101 08/25/2019 1350   CO2 29 08/25/2019 1350   GLUCOSE 87 08/25/2019 1350   BUN 14 08/25/2019 1350   CREATININE  0.83 08/25/2019 1350   CALCIUM 9.3 08/25/2019 1350   PROT 7.1 08/25/2019 1350   ALBUMIN 4.1 08/25/2019 1350   AST 15 08/25/2019 1350   ALT 9 08/25/2019 1350   ALKPHOS 57 08/25/2019 1350   BILITOT 0.6 08/25/2019 1350   GFRNONAA >60 08/25/2019 1350   GFRAA >60 08/25/2019 1350    No results found for: "TOTALPROTELP", "ALBUMINELP", "A1GS", "A2GS", "BETS", "BETA2SER", "GAMS", "MSPIKE", "SPEI"  No results found for: "KPAFRELGTCHN", "LAMBDASER", "KAPLAMBRATIO"  Lab Results  Component Value Date   WBC 6.3 08/25/2019   NEUTROABS 4.1 08/25/2019   HGB 13.0 08/25/2019   HCT 38.5 08/25/2019   MCV 96.7 08/25/2019   PLT 192 08/25/2019   No results found for: "LABCA2"  No components found for: "UXLKGM010"  No results for input(s): "INR" in the last 168 hours.  No results found for: "LABCA2"  No results found for: "UVO536"  No results found for: "CAN125"  No results found for: "CAN153"  No results found for: "CA2729"  No components found for: "HGQUANT"  No results found for: "CEA1", "CEA" / No results found for: "CEA1", "CEA"   No results found for: "AFPTUMOR"  No results found for: "CHROMOGRNA"  No results found for: "HGBA", "HGBA2QUANT", "HGBFQUANT", "HGBSQUAN" (Hemoglobinopathy evaluation)   No results found for: "LDH"  No results found for: "IRON", "TIBC", "IRONPCTSAT" (Iron and TIBC)  No results found for: "FERRITIN"  Urinalysis    Component Value Date/Time   COLORURINE YELLOW 08/13/2010 2025   APPEARANCEUR CLEAR 08/13/2010 2025   LABSPEC <1.005 (L) 08/13/2010 2025   PHURINE 6.5 08/13/2010 2025   GLUCOSEU NEGATIVE 08/13/2010 2025   HGBUR LARGE (A) 08/13/2010 2025   BILIRUBINUR NEGATIVE 08/13/2010 2025   KETONESUR NEGATIVE 08/13/2010 2025   PROTEINUR NEGATIVE 08/13/2010 2025   UROBILINOGEN 0.2 08/13/2010 2025   NITRITE NEGATIVE 08/13/2010 2025   LEUKOCYTESUR NEGATIVE 08/13/2010 2025    STUDIES: No results found.    ELIGIBLE FOR AVAILABLE RESEARCH  PROTOCOL: no  ASSESSMENT: 41 y.o. [place] woman with a significant family history for breast and pancreatic cancer and a predicted lifetime risk of breast cancer of 22.6%  (  1) took tamoxifen tamoxifen for a few months in 2020-21 with poor tolerance  (2) on intensified screening: Breast density category C  (a) mammography alternating with breast MRI every 6 months  (b) continue intensified screening onto breast density B or less  (c) biannual physician breast exam   PLAN:  Patient is here for follow-up. Since last visit, no new breast changes.  Last mammogram in January 2023 with no concerns, breast density category C. She is due for an MR breast.  We have today discussed about unknown long-term risk of gadolinium deposition and increased biopsies with MRIs.  At this time since her breast density category C, I think it is reasonable to continue intensified screening. I ordered MR breast for follow-up.  No concerns on exam today except for the heterogeneous density noted. She will return to clinic in 1 year or sooner as needed.  Self breast exam recommended  Total time spent: 30 minutes  *Total Encounter Time as defined by the Centers for Medicare and Medicaid Services includes, in addition to the face-to-face time of a patient visit (documented in the note above) non-face-to-face time: obtaining and reviewing outside history, ordering and reviewing medications, tests or procedures, care coordination (communications with other health care professionals or caregivers) and documentation in the medical record.

## 2022-03-01 ENCOUNTER — Other Ambulatory Visit: Payer: BC Managed Care – PPO

## 2022-04-10 ENCOUNTER — Other Ambulatory Visit: Payer: Self-pay | Admitting: Hematology and Oncology

## 2022-04-10 ENCOUNTER — Ambulatory Visit
Admission: RE | Admit: 2022-04-10 | Discharge: 2022-04-10 | Disposition: A | Discharge status: Home / Self Care | Payer: BC Managed Care – PPO | Source: Ambulatory Visit | Attending: Hematology and Oncology | Admitting: Hematology and Oncology

## 2022-04-10 DIAGNOSIS — Z1239 Encounter for other screening for malignant neoplasm of breast: Secondary | ICD-10-CM | POA: Diagnosis not present

## 2022-04-10 DIAGNOSIS — R9389 Abnormal findings on diagnostic imaging of other specified body structures: Secondary | ICD-10-CM

## 2022-04-10 MED ORDER — GADOPICLENOL 0.5 MMOL/ML IV SOLN
7.0000 mL | Freq: Once | INTRAVENOUS | Status: AC | PRN
  Administered 2022-04-10: 7 mL via INTRAVENOUS

## 2022-04-17 ENCOUNTER — Ambulatory Visit
Admission: RE | Admit: 2022-04-17 | Discharge: 2022-04-17 | Disposition: A | Discharge status: Home / Self Care | Payer: BC Managed Care – PPO | Source: Ambulatory Visit | Attending: Hematology and Oncology | Admitting: Hematology and Oncology

## 2022-04-17 DIAGNOSIS — N6012 Diffuse cystic mastopathy of left breast: Secondary | ICD-10-CM | POA: Diagnosis not present

## 2022-04-17 DIAGNOSIS — R9389 Abnormal findings on diagnostic imaging of other specified body structures: Secondary | ICD-10-CM

## 2022-04-17 DIAGNOSIS — N6342 Unspecified lump in left breast, subareolar: Secondary | ICD-10-CM | POA: Diagnosis not present

## 2022-04-17 MED ORDER — GADOPICLENOL 0.5 MMOL/ML IV SOLN
7.0000 mL | Freq: Once | INTRAVENOUS | Status: AC | PRN
  Administered 2022-04-17: 7 mL via INTRAVENOUS

## 2022-04-27 ENCOUNTER — Other Ambulatory Visit: Payer: Self-pay | Admitting: Hematology and Oncology

## 2022-04-27 DIAGNOSIS — Z1239 Encounter for other screening for malignant neoplasm of breast: Secondary | ICD-10-CM

## 2022-04-27 NOTE — Progress Notes (Signed)
MR breast ordered per radiology protocol.  Cynthia Chase

## 2022-06-13 DIAGNOSIS — Z0000 Encounter for general adult medical examination without abnormal findings: Secondary | ICD-10-CM | POA: Diagnosis not present

## 2022-06-13 DIAGNOSIS — L401 Generalized pustular psoriasis: Secondary | ICD-10-CM | POA: Diagnosis not present

## 2022-06-13 DIAGNOSIS — I7300 Raynaud's syndrome without gangrene: Secondary | ICD-10-CM | POA: Diagnosis not present

## 2022-09-04 ENCOUNTER — Ambulatory Visit (INDEPENDENT_AMBULATORY_CARE_PROVIDER_SITE_OTHER): Payer: BC Managed Care – PPO | Admitting: Gastroenterology

## 2022-09-04 ENCOUNTER — Encounter: Payer: Self-pay | Admitting: Gastroenterology

## 2022-09-04 VITALS — BP 95/61 | HR 60 | Temp 98.0°F | Ht 68.0 in | Wt 157.0 lb

## 2022-09-04 DIAGNOSIS — K648 Other hemorrhoids: Secondary | ICD-10-CM | POA: Insufficient documentation

## 2022-09-04 DIAGNOSIS — K649 Unspecified hemorrhoids: Secondary | ICD-10-CM | POA: Insufficient documentation

## 2022-09-04 DIAGNOSIS — K641 Second degree hemorrhoids: Secondary | ICD-10-CM

## 2022-09-04 MED ORDER — HYDROCORTISONE (PERIANAL) 2.5 % EX CREA: 1.0000 | TOPICAL_CREAM | Freq: Two times a day (BID) | CUTANEOUS | 1 refills | Status: DC

## 2022-09-04 MED ORDER — KETOCONAZOLE 2 % EX CREA: 1.0000 | TOPICAL_CREAM | Freq: Two times a day (BID) | CUTANEOUS | 0 refills | Status: DC

## 2022-09-04 NOTE — Progress Notes (Unsigned)
Gastroenterology Office Note     Primary Care Physician:  Richardean Chimera, MD  Primary Gastroenterologist: Dr. Marletta Lor    Chief Complaint   Chief Complaint  Patient presents with   Hemorrhoids    Concerns about hemorrhoid. Having pain, itching and bleeding. Tried otc creams and suppositories.      History of Present Illness   Cynthia Chase is a 42 y.o. female presenting today with history of chronic constipation, anal fissure, hemorrhoids, on Motegrity. Last seen in Nov 2022.   Now presenting with hemorrhoid flare. For one month has had itching abd burning. Felt swolleni nitially. Felt dry/cracking around anus. Bleeding comes and goes. No significant improvement with OTC agents.       Colonoscopy 2017: non-bleeding internal hemorrhoids and anal fissure.    Past Medical History:  Diagnosis Date   Abnormal Pap smear    2006   Anemia    With pregnancy   Arthritis    Osteoarthritis; bilateral knees   Headache    Migraines   History of anal fissures    saw Dr Jonette Eva gastro for eval or rectal bleeding and lower abdominal pain, diagnostic colonoscopy 2017;  denies current abdomnal symptoms    PONV (postoperative nausea and vomiting)    does well with scopolamine    Raynaud disease    Right ovarian cyst    Shingles    3rd and 4th grade; denies flare-ups    Varicella    as a child   Wears glasses     Past Surgical History:  Procedure Laterality Date   ABDOMINAL HYSTERECTOMY     ADENOIDECTOMY     still has tonsils    ARTHROSCOPIC REPAIR ACL Right 1998, 2003   Dr Eugenia Mcalpine    ARTHROSCOPIC REPAIR ACL Left 2000   Dr Eugenia Mcalpine   BILATERAL SALPINGECTOMY Bilateral 01/03/2015   Procedure: BILATERAL SALPINGECTOMY;  Surgeon: Hal Morales, MD;  Location: [facility];  Service: Gynecology;  Laterality: Bilateral;   BUNIONECTOMY  2012   done by surgeon at [facility]( [facility]), patient does not remember name of surgeon     COLONOSCOPY N/A 08/04/2015   non-bleeding external and internal hemorrhoids, anal fissure   DILATION AND CURETTAGE OF UTERUS  2008   [name] [facility]    KNEE SURGERY     LAPAROSCOPIC ASSISTED VAGINAL HYSTERECTOMY N/A 01/03/2015   Procedure: LAPAROSCOPIC ASSISTED VAGINAL HYSTERECTOMY;  Surgeon: Hal Morales, MD;  Location: [facility];  Service: Gynecology;  Laterality: N/A;   WISDOM TOOTH EXTRACTION      Current Outpatient Medications  Medication Sig Dispense Refill   Sodium Phosphates (FLEET ENEMA RE) Place rectally as needed.     No current facility-administered medications for this visit.    Allergies as of 09/04/2022 - Review Complete 09/04/2022  Allergen Reaction Noted   Gentamicin  07/27/2015   Adhesive [tape]  05/27/2017   Sulfa antibiotics Rash 11/16/2010    Family History  Problem Relation Age of Onset   Hyperlipidemia Mother    Cancer Mother    Cancer Maternal Grandfather    Colon polyps Father    Breast cancer Paternal Aunt    Breast cancer Paternal Grandmother    Breast cancer Cousin    Colon cancer Neg Hx    Ulcerative colitis Neg Hx    Crohn's disease Neg Hx     Social History   Socioeconomic History   Marital status: Married    Spouse  name: Not on file   Number of children: Not on file   Years of education: Not on file   Highest education level: Not on file  Occupational History   Not on file  Tobacco Use   Smoking status: Never    Passive exposure: Never   Smokeless tobacco: Never   Tobacco comments:    Never smoked  Vaping Use   Vaping Use: Never used  Substance and Sexual Activity   Alcohol use: Yes    Alcohol/week: 0.0 standard drinks of alcohol    Comment: occassional   Drug use: No   Sexual activity: Yes    Birth control/protection: Post-menopausal  Other Topics Concern   Not on file  Social History Narrative   SELLS INSURANCE FOR A LIVING. 2 KIDS(4,7)-MARRIED. BORN AND RAISED IN [place]. WENT TO [place] AND  PLAYED BASKETBALL.    Social Determinants of Health   Financial Resource Strain: Not on file  Food Insecurity: Not on file  Transportation Needs: Not on file  Physical Activity: Not on file  Stress: Not on file  Social Connections: Not on file  Intimate Partner Violence: Not on file     Review of Systems   Gen: Denies any fever, chills, fatigue, weight loss, lack of appetite.  CV: Denies chest pain, heart palpitations, peripheral edema, syncope.  Resp: Denies shortness of breath at rest or with exertion. Denies wheezing or cough.  GI: Denies dysphagia or odynophagia. Denies jaundice, hematemesis, fecal incontinence. GU : Denies urinary burning, urinary frequency, urinary hesitancy MS: Denies joint pain, muscle weakness, cramps, or limitation of movement.  Derm: Denies rash, itching, dry skin Psych: Denies depression, anxiety, memory loss, and confusion Heme: Denies bruising, bleeding, and enlarged lymph nodes.   Physical Exam   Ht 5\' 8"  (1.727 m)   Wt 157 lb (71.2 kg)   LMP 11/18/2014 Comment: partial   BMI 23.87 kg/m  General:   Alert and oriented. Pleasant and cooperative. Well-nourished and well-developed.  Head:  Normocephalic and atraumatic. Eyes:  Without icterus Abdomen:  +BS, soft, non-tender and non-distended. No HSM noted. No guarding or rebound. No masses appreciated.  Rectal:  Deferred  Msk:  Symmetrical without gross deformities. Normal posture. Extremities:  Without edema. Neurologic:  Alert and  oriented x4;  grossly normal neurologically. Skin:  Intact without significant lesions or rashes. Psych:  Alert and cooperative. Normal mood and affect.   Assessment   Cynthia Chase is a 42 y.o. female presenting today in follow-up with a history of    PLAN   *****    Gelene Mink, PhD, ANP-BC [facility]

## 2022-09-04 NOTE — Patient Instructions (Signed)
I have sent in ketoconazole to apply twice a day to rash around anus opening.   I have also sent in Anusol to use twice a day for hemorrhoids as needed.  Avoid straining, limit toilet time to 2-3 minutes. You can take 1-2 stool softeners a day +/- Miralax as needed to keep stools soft.  Please message with any concerns!  We can see you after your trip for hemorrhoid banding!  I enjoyed seeing you again today! I value our relationship and want to provide genuine, compassionate, and quality care. You may receive a survey regarding your visit with me, and I welcome your feedback! Thanks so much for taking the time to complete this. I look forward to seeing you again.      Gelene Mink, PhD, ANP-BC [facility]

## 2022-09-09 DIAGNOSIS — H40013 Open angle with borderline findings, low risk, bilateral: Secondary | ICD-10-CM | POA: Diagnosis not present

## 2022-09-09 DIAGNOSIS — H5213 Myopia, bilateral: Secondary | ICD-10-CM | POA: Diagnosis not present

## 2022-09-10 ENCOUNTER — Telehealth: Payer: Self-pay

## 2022-09-10 NOTE — Telephone Encounter (Signed)
Reviewed last OV note with Tobi Bastos. She did not have an anal fissure. She had supsected yeast like rash perianally and was prescribed ketoconazole (an antifungal) to treat this. She can pick up over the counter clotrimazole to use in place of ketoconazole. She will apply twice daily, perianally to the affected area.   If she suspects she has an anal fissure, she will need to be evaluated in the office.

## 2022-09-10 NOTE — Telephone Encounter (Signed)
Pt phoned stating that since last week when she had a banding and she has a anal fissure. She was prescribed a cream for the fissure because it felt dry and was cracking. The Ketoconazole made it worse states the pt. She stated she looked up the ingredients and it has sulfur in it which she is allergic to. She is in need of another cream to be sent to her pharmacy. Please advise

## 2022-09-11 NOTE — Telephone Encounter (Signed)
Spoke with the pt and advised of the instructions, and recommendations if she thins she has a anal fissure. Pt expressed understanding and will call back if this does not help

## 2022-09-11 NOTE — Telephone Encounter (Signed)
Phoned the pt and her vm was full. 

## 2022-09-23 NOTE — Telephone Encounter (Signed)
Mandy: can you put patient in a slot with me on Wednesday at 930? I believe there is an overbook slot available.

## 2022-09-25 ENCOUNTER — Ambulatory Visit (INDEPENDENT_AMBULATORY_CARE_PROVIDER_SITE_OTHER): Payer: BC Managed Care – PPO | Admitting: Gastroenterology

## 2022-09-25 ENCOUNTER — Encounter: Payer: Self-pay | Admitting: Gastroenterology

## 2022-09-25 VITALS — BP 119/71 | HR 72 | Temp 97.5°F | Ht 68.0 in | Wt 158.9 lb

## 2022-09-25 DIAGNOSIS — K641 Second degree hemorrhoids: Secondary | ICD-10-CM

## 2022-09-25 NOTE — Progress Notes (Signed)
Colonoscopy 2017: non-bleeding internal hemorrhoids and anal fissure    Anoscopy Grade 2 predominantly left lateral, right posterior, followed by right anterior,  Left lateral      [facility] BANDING PROCEDURE NOTE  Cynthia Chase is a 42 y.o. female presenting today for consideration of hemorrhoid banding. Last colonoscopy 2-17 with non-bleeding internal hemorrhoids and anal fissure. She has had intermittent bleeding, itching, burning. Yeast-like area peri-rectally improved with changing from ketoconazole to clotrimazole.    The patient presents with symptomatic grade 2 hemorrhoids, unresponsive to maximal medical therapy, requesting rubber band ligation of her hemorrhoidal disease. All risks, benefits, and alternative forms of therapy were described and informed consent was obtained.  In the left lateral decubitus position, anoscopic examination revealed grade 2 hemorrhoids in the left lateral, right posterior, and right anterior. Left lateral and right posterior most prominent.   The decision was made to band the left lateral internal hemorrhoid, and the [facility] O'Regan System was used to perform band ligation without complication. Digital anorectal examination was then performed to assure proper positioning of the band, and to adjust the banded tissue as required. The patient was discharged home without pain or other issues. Dietary and behavioral recommendations were given, along with follow-up instructions. The patient will return in several weeks for followup and possible additional banding as required.  No complications were encountered and the patient tolerated the procedure well.   Gelene Mink, PhD, ANP-BC [facility]

## 2022-09-25 NOTE — Patient Instructions (Signed)
  Please avoid straining.  You should limit your toilet time to 2-3 minutes at the most.   I recommend Benefiber 2 teaspoons each morning in the beverage of your choice!  Please call me with any concerns or issues!  I will see you in follow-up for additional banding in several weeks.   I enjoyed seeing you again today! I value our relationship and want to provide genuine, compassionate, and quality care. You may receive a survey regarding your visit with me, and I welcome your feedback! Thanks so much for taking the time to complete this. I look forward to seeing you again.      Jasiel Apachito W. Luanna Weesner, PhD, ANP-BC [facility]       

## 2022-10-17 ENCOUNTER — Ambulatory Visit (INDEPENDENT_AMBULATORY_CARE_PROVIDER_SITE_OTHER): Payer: BC Managed Care – PPO | Admitting: Gastroenterology

## 2022-10-17 ENCOUNTER — Encounter: Payer: BC Managed Care – PPO | Admitting: Gastroenterology

## 2022-10-17 ENCOUNTER — Encounter: Payer: Self-pay | Admitting: Gastroenterology

## 2022-10-17 VITALS — BP 104/69 | HR 90 | Temp 98.6°F | Ht 68.0 in | Wt 155.2 lb

## 2022-10-17 DIAGNOSIS — B379 Candidiasis, unspecified: Secondary | ICD-10-CM

## 2022-10-17 MED ORDER — FLUCONAZOLE 150 MG PO TABS: 150.0000 mg | ORAL_TABLET | Freq: Once | ORAL | 1 refills | Status: AC

## 2022-10-17 NOTE — Patient Instructions (Signed)
  Please avoid straining.  You should limit your toilet time to 2-3 minutes at the most.   I recommend Benefiber 2 teaspoons each morning in the beverage of your choice!  I have sent in Diflucan. Take one tablet today and repeat in 72 hours if no improvement.  Please message or call if no improvement after this!  I will see you back around third week of August!  I enjoyed seeing you again today! I value our relationship and want to provide genuine, compassionate, and quality care. You may receive a survey regarding your visit with me, and I welcome your feedback! Thanks so much for taking the time to complete this. I look forward to seeing you again.      Gelene Mink, PhD, ANP-BC [facility]

## 2022-10-17 NOTE — Progress Notes (Signed)
Gastroenterology Office Note     Primary Care Physician:  Richardean Chimera, MD  Primary Gastroenterologist: Dr. Marletta Lor    Chief Complaint   Chief Complaint  Patient presents with   Hemorrhoids    Pt here for a banding     History of Present Illness   Cynthia Chase is a 42 y.o. female presenting today for consideration of hemorrhoid banding. Last colonoscopy 2017 with non-bleeding internal hemorrhoids and anal fissure. She has had intermittent bleeding, itching, burning. Yeast-like area peri-rectally improved with changing from ketoconazole to clotrimazole. She has had an anoscopy at last visit with Grade 2 hemorrhoids in all columns. We have banded left lateral thus far.   She had some discomfort/pressure after last banding. Her main concern is persistent fungal-type rash around buttocks. She had some improvement with clotrimazole but now at a standstill. Notes burning and itching. She would like to band when returning from vacations.    Past Medical History:  Diagnosis Date   Abnormal Pap smear    2006   Anemia    With pregnancy   Arthritis    Osteoarthritis; bilateral knees   Headache    Migraines   History of anal fissures    saw Dr Jonette Eva gastro for eval or rectal bleeding and lower abdominal pain, diagnostic colonoscopy 2017;  denies current abdomnal symptoms    PONV (postoperative nausea and vomiting)    does well with scopolamine    Raynaud disease    Right ovarian cyst    Shingles    3rd and 4th grade; denies flare-ups    Varicella    as a child   Wears glasses     Past Surgical History:  Procedure Laterality Date   ABDOMINAL HYSTERECTOMY     ADENOIDECTOMY     still has tonsils    ARTHROSCOPIC REPAIR ACL Right 1998, 2003   Dr Eugenia Mcalpine    ARTHROSCOPIC REPAIR ACL Left 2000   Dr Eugenia Mcalpine   BILATERAL SALPINGECTOMY Bilateral 01/03/2015   Procedure: BILATERAL SALPINGECTOMY;  Surgeon: Hal Morales, MD;  Location: [facility];   Service: Gynecology;  Laterality: Bilateral;   BUNIONECTOMY  2012   done by surgeon at [facility]( [facility]), patient does not remember name of surgeon    COLONOSCOPY N/A 08/04/2015   non-bleeding external and internal hemorrhoids, anal fissure   DILATION AND CURETTAGE OF UTERUS  2008   Dr [name] at [facility]    KNEE SURGERY     LAPAROSCOPIC ASSISTED VAGINAL HYSTERECTOMY N/A 01/03/2015   Procedure: LAPAROSCOPIC ASSISTED VAGINAL HYSTERECTOMY;  Surgeon: Hal Morales, MD;  Location: [facility];  Service: Gynecology;  Laterality: N/A;   WISDOM TOOTH EXTRACTION      Current Outpatient Medications  Medication Sig Dispense Refill   fluconazole (DIFLUCAN) 150 MG tablet Take 1 tablet (150 mg total) by mouth once for 1 dose. Repeat in 72 hours if no improvement 2 tablet 1   OVER THE COUNTER MEDICATION Clotrimazole Over the counter rectal cream prn.     Sodium Phosphates (FLEET ENEMA RE) Place rectally as needed.     No current facility-administered medications for this visit.    Allergies as of 10/17/2022 - Review Complete 10/17/2022  Allergen Reaction Noted   Gentamicin  07/27/2015   Adhesive [tape]  05/27/2017   Sulfa antibiotics Rash 11/16/2010    Family History  Problem Relation Age of Onset   Hyperlipidemia Mother    Cancer Mother  Cancer Maternal Grandfather    Colon polyps Father    Breast cancer Paternal Aunt    Breast cancer Paternal Grandmother    Breast cancer Cousin    Colon cancer Neg Hx    Ulcerative colitis Neg Hx    Crohn's disease Neg Hx     Social History   Socioeconomic History   Marital status: Married    Spouse name: Not on file   Number of children: Not on file   Years of education: Not on file   Highest education level: Not on file  Occupational History   Not on file  Tobacco Use   Smoking status: Never    Passive exposure: Never   Smokeless tobacco: Never   Tobacco comments:    Never smoked  Vaping Use   Vaping status:  Never Used  Substance and Sexual Activity   Alcohol use: Yes    Alcohol/week: 0.0 standard drinks of alcohol    Comment: occassional   Drug use: No   Sexual activity: Yes    Birth control/protection: Post-menopausal  Other Topics Concern   Not on file  Social History Narrative   SELLS INSURANCE FOR A LIVING. 2 KIDS(4,7)-MARRIED. BORN AND RAISED IN [place]. WENT TO [place] AND PLAYED BASKETBALL.    Social Determinants of Health   Financial Resource Strain: Not on file  Food Insecurity: Not on file  Transportation Needs: Not on file  Physical Activity: Not on file  Stress: Not on file  Social Connections: Unknown (08/07/2021)   Received from [facility]   Social Network    Social Network: Not on file  Intimate Partner Violence: Unknown (06/29/2021)   Received from [facility]   HITS    Physically Hurt: Not on file    Insult or Talk Down To: Not on file    Threaten Physical Harm: Not on file    Scream or Curse: Not on file     Review of Systems   Gen: Denies any fever, chills, fatigue, weight loss, lack of appetite.  CV: Denies chest pain, heart palpitations, peripheral edema, syncope.  Resp: Denies shortness of breath at rest or with exertion. Denies wheezing or cough.  GI: Denies dysphagia or odynophagia. Denies jaundice, hematemesis, fecal incontinence. GU : Denies urinary burning, urinary frequency, urinary hesitancy MS: Denies joint pain, muscle weakness, cramps, or limitation of movement.  Derm: see HPI Psych: Denies depression, anxiety, memory loss, and confusion Heme: Denies bruising, bleeding, and enlarged lymph nodes.   Physical Exam   BP 104/69   Pulse 90   Temp 98.6 F (37 C)   Ht 5\' 8"  (1.727 m)   Wt 155 lb 3.2 oz (70.4 kg)   LMP 11/18/2014 Comment: partial   BMI 23.60 kg/m  General:   Alert and oriented. Pleasant and cooperative. Well-nourished and well-developed.  Head:  Normocephalic and atraumatic. Eyes:  Without icterus Rectal:  no  external hemorrhoids. Well-demarcated erythema extending approximately 3-4 cm peri-anally and tracking into vulva area.  Msk:  Symmetrical without gross deformities. Normal posture. Extremities:  Without edema. Neurologic:  Alert and  oriented x4;  grossly normal neurologically. Skin:  Intact without significant lesions or rashes. Psych:  Alert and cooperative. Normal mood and affect.   Assessment   Cynthia Chase is a 42 y.o. female presenting today  for consideration of hemorrhoid banding. Last colonoscopy 2017 with non-bleeding internal hemorrhoids and anal fissure. She has had intermittent bleeding, itching, burning. Yeast-like area peri-rectally improved with changing from  ketoconazole to clotrimazole. She has had an anoscopy at last visit with Grade 2 hemorrhoids in all columns. We have banded left lateral thus far.   At this point, she would still benefit from banding. However, she has upcoming trips and would like to wait until third week of August. Perianal yeast remains an issue despite creams and seems to extend into perineum as well. Will give oral Diflucan X 1 and repeat if needed.   PLAN   Diflucan 150 mg X 1 and repeat in 72 hours if needed Message if no improvement Return for banding in late August    Gelene Mink, PhD, [facility] Gastroenterology

## 2022-10-22 MED ORDER — CLOBETASOL PROPIONATE 0.05 % EX OINT: 1.0000 | TOPICAL_OINTMENT | Freq: Two times a day (BID) | CUTANEOUS | 0 refills | Status: DC

## 2022-10-22 NOTE — Addendum Note (Signed)
Addended by: Gelene Mink on: 10/22/2022 12:50 PM   Modules accepted: Orders

## 2022-11-14 ENCOUNTER — Encounter: Payer: Self-pay | Admitting: Gastroenterology

## 2022-11-14 ENCOUNTER — Ambulatory Visit: Payer: BC Managed Care – PPO | Admitting: Gastroenterology

## 2022-11-29 ENCOUNTER — Other Ambulatory Visit: Payer: Self-pay | Admitting: Hematology and Oncology

## 2022-11-29 DIAGNOSIS — Z1231 Encounter for screening mammogram for malignant neoplasm of breast: Secondary | ICD-10-CM

## 2022-12-18 ENCOUNTER — Ambulatory Visit
Admission: RE | Admit: 2022-12-18 | Discharge: 2022-12-18 | Disposition: A | Discharge status: Home / Self Care | Payer: BC Managed Care – PPO | Source: Ambulatory Visit | Attending: Hematology and Oncology | Admitting: Hematology and Oncology

## 2022-12-18 DIAGNOSIS — Z1231 Encounter for screening mammogram for malignant neoplasm of breast: Secondary | ICD-10-CM | POA: Diagnosis not present

## 2022-12-26 ENCOUNTER — Encounter: Payer: Self-pay | Admitting: Gastroenterology

## 2022-12-26 ENCOUNTER — Ambulatory Visit: Payer: BC Managed Care – PPO | Admitting: Gastroenterology

## 2022-12-26 VITALS — BP 110/67 | HR 88 | Temp 98.9°F | Ht 68.0 in | Wt 156.2 lb

## 2022-12-26 DIAGNOSIS — K648 Other hemorrhoids: Secondary | ICD-10-CM | POA: Diagnosis not present

## 2022-12-26 NOTE — Patient Instructions (Signed)
I have called a compounded cream to [facility] to use twice a day for the rectum.   I will see you in 2 weeks!  Please message with any concerns in the meantime!  I enjoyed seeing you again today! I value our relationship and want to provide genuine, compassionate, and quality care. You may receive a survey regarding your visit with me, and I welcome your feedback! Thanks so much for taking the time to complete this. I look forward to seeing you again.      Gelene Mink, PhD, ANP-BC [facility] Gastroenterology

## 2022-12-26 NOTE — Progress Notes (Signed)
    [facility] BANDING PROCEDURE NOTE  Cynthia Chase is a 42 y.o. female presenting today for consideration of hemorrhoid banding. Last colonoscopy 2017 with non-bleeding internal hemorrhoids and anal fissure. She has had intermittent bleeding, itching, burning. Yeast-like area peri-rectally improved with changing from ketoconazole to clotrimazole. She has had left lateral banding thus far.    The patient presents with symptomatic grade 2 hemorrhoids, unresponsive to maximal medical therapy, requesting rubber band ligation of her hemorrhoidal disease. All risks, benefits, and alternative forms of therapy were described and informed consent was obtained.   The decision was made to band the right posterior internal hemorrhoid, and the [facility] O'Regan System was used to perform band ligation without complication. Digital anorectal examination was then performed to assure proper positioning of the band, and to adjust the banded tissue as required. The patient was discharged home without pain or other issues. Dietary and behavioral recommendations were given, along with follow-up instructions. The patient will return in several weeks for followup and possible additional banding as required. Patient had some discomfort at sphincter location with banding. History of anal fissure. Will be proactive and use compounded [facility] cream with diltiazem and lidocaine per rectum BID.   No complications were encountered and the patient tolerated the procedure well.   Gelene Mink, PhD, ANP-BC [facility] Gastroenterology

## 2023-01-09 DIAGNOSIS — R5383 Other fatigue: Secondary | ICD-10-CM | POA: Diagnosis not present

## 2023-01-09 DIAGNOSIS — Z0000 Encounter for general adult medical examination without abnormal findings: Secondary | ICD-10-CM | POA: Diagnosis not present

## 2023-01-09 DIAGNOSIS — Z13220 Encounter for screening for lipoid disorders: Secondary | ICD-10-CM | POA: Diagnosis not present

## 2023-01-21 ENCOUNTER — Encounter: Payer: BC Managed Care – PPO | Admitting: Gastroenterology

## 2023-01-23 DIAGNOSIS — Z6824 Body mass index (BMI) 24.0-24.9, adult: Secondary | ICD-10-CM | POA: Diagnosis not present

## 2023-01-23 DIAGNOSIS — Z0000 Encounter for general adult medical examination without abnormal findings: Secondary | ICD-10-CM | POA: Diagnosis not present

## 2023-02-04 ENCOUNTER — Inpatient Hospital Stay: Payer: BC Managed Care – PPO | Attending: Hematology and Oncology | Admitting: Hematology and Oncology

## 2023-02-04 VITALS — BP 112/75 | HR 71 | Temp 97.2°F | Resp 16 | Wt 160.7 lb

## 2023-02-04 DIAGNOSIS — Z800 Family history of malignant neoplasm of digestive organs: Secondary | ICD-10-CM | POA: Diagnosis not present

## 2023-02-04 DIAGNOSIS — Z1239 Encounter for other screening for malignant neoplasm of breast: Secondary | ICD-10-CM

## 2023-02-04 DIAGNOSIS — Z801 Family history of malignant neoplasm of trachea, bronchus and lung: Secondary | ICD-10-CM | POA: Diagnosis not present

## 2023-02-04 DIAGNOSIS — Z803 Family history of malignant neoplasm of breast: Secondary | ICD-10-CM | POA: Insufficient documentation

## 2023-02-04 DIAGNOSIS — Z8616 Personal history of COVID-19: Secondary | ICD-10-CM | POA: Insufficient documentation

## 2023-02-04 DIAGNOSIS — Z9189 Other specified personal risk factors, not elsewhere classified: Secondary | ICD-10-CM | POA: Insufficient documentation

## 2023-02-04 NOTE — Progress Notes (Signed)
[facility]  [phone] [phone]) [phone]     ID: Cynthia Chase DOB: [birth year]  MR#: [id number]  QMV#:[id number]  Patient Care Team: Richardean Chimera, MD as PCP - General (Chase Medicine) West Bali, MD (Inactive) as Consulting Physician (Gastroenterology) Nada Libman, MD as Consulting Physician (Vascular Surgery) Magrinat, Valentino Hue, MD (Inactive) as Consulting Physician (Oncology) Corky Sox, MD (Inactive) as Consulting Physician (Radiology) Rachel Moulds, MD OTHER MD: Dr. Dierdre Forth (OBGYN)  CHIEF COMPLAINT: high risk for breast cancer  CURRENT TREATMENT: Intensified screening    COVID 19 VACCINATION STATUS: Had Covid September 2021.  Had Moderna x1 by October 2020  HISTORY OF CURRENT ILLNESS: From the original intake note:  Cynthia Chase has a Chase history of breast cancer, detailed below.  It was recommended she start mammography early, and she had her first screening mammography on 12/03/2018 showing a possible abnormality in the right breast. She underwent right diagnostic mammography with tomography and ultrasonography at [facility] on 12/07/2018 showing: breast density category C; benign cyst within the right breast at the 10 o'clock axis, measuring 7 mm, corresponding to the mammographic finding.  She was referred to the high-risk clinic for further discussion of risk management. She couldn't tolerate tamoxifen. Her subsequent history is as detailed below.  Discussed the use of AI scribe software for clinical note transcription with the patient, who gave verbal consent to proceed.  History of Present Illness    Cynthia Chase is here for a follow up. She is doing well, exercising regularly and staying healthy. The patient also reports experiencing chest pain, particularly during exercise when the heart rate is high. The pain is suspected to be due to a pulled pectoral muscle or intercostal muscle, but due to  the location and nature of the pain, the patient is being referred to a cardiologist for further investigation. The patient's EKG was reported as normal.  The patient also mentions a history of knee issues, which have improved with targeted weight training. The patient is currently working with a trainer and has seen improvements in her ability to run without pain.  She denies any breast changes.  She is due for her MRI early 2025 again.  PAST MEDICAL HISTORY: Past Medical History:  Diagnosis Date   Abnormal Pap smear    2006   Anemia    With pregnancy   Arthritis    Osteoarthritis; bilateral knees   Headache    Migraines   History of anal fissures    saw Dr Jonette Eva gastro for eval or rectal bleeding and lower abdominal pain, diagnostic colonoscopy 2017;  denies current abdomnal symptoms    PONV (postoperative nausea and vomiting)    does well with scopolamine    Raynaud disease    Right ovarian cyst    Shingles    3rd and 4th grade; denies flare-ups    Varicella    as a child   Wears glasses     PAST SURGICAL HISTORY: Past Surgical History:  Procedure Laterality Date   ABDOMINAL HYSTERECTOMY     ADENOIDECTOMY     still has tonsils    ARTHROSCOPIC REPAIR ACL Right 1998, 2003   Dr Eugenia Mcalpine    ARTHROSCOPIC REPAIR ACL Left 2000   Dr Eugenia Mcalpine   BILATERAL SALPINGECTOMY Bilateral 01/03/2015   Procedure: BILATERAL SALPINGECTOMY;  Surgeon: Hal Morales, MD;  Location: [facility];  Service: Gynecology;  Laterality: Bilateral;   BUNIONECTOMY  2012  done by surgeon at [facility]( [facility]), patient does not remember name of surgeon    COLONOSCOPY N/A 08/04/2015   non-bleeding external and internal hemorrhoids, anal fissure   DILATION AND CURETTAGE OF UTERUS  2008   Dr [name] at [facility]    KNEE SURGERY     LAPAROSCOPIC ASSISTED VAGINAL HYSTERECTOMY N/A 01/03/2015   Procedure: LAPAROSCOPIC ASSISTED VAGINAL HYSTERECTOMY;  Surgeon: Hal Morales, MD;  Location: [facility];  Service: Gynecology;  Laterality: N/A;   WISDOM TOOTH EXTRACTION      Chase HISTORY: Chase History  Problem Relation Age of Onset   Hyperlipidemia Mother    Cancer Mother    Cancer Maternal Grandfather    Colon polyps Father    Breast cancer Paternal Aunt    Breast cancer Paternal Grandmother    Breast cancer Cousin    Colon cancer Neg Hx    Ulcerative colitis Neg Hx    Crohn's disease Neg Hx   The patient's father is 40 year old as of October 2020.  He has 1 sister, who had breast cancer, unknown age of initial diagnosis.  That sister has a daughter, the patient's first cousin, also with breast cancer, diagnosed before the age of 40.  The patient's father had 1 brother who died in his 63s from lung cancer in the setting of tobacco abuse.  The patient's father's mother had breast cancer, age of diagnosis unknown.  The patient's father's father had pancreatic cancer and his nephew, the patient is father's brother son, also had pancreatic cancer.  On the maternal side the patient's mother is 32 years old as of October 2020.  The maternal grandfather had carcinoid.  There is no history of breast ovarian or pancreatic cancer on the maternal side of the Chase.  The patient herself has 1 brother, no sisters, with no history of cancer.     GYNECOLOGIC HISTORY:  Patient's last menstrual period was 11/18/2014. Menarche: 42 years old Age at first live birth: 42 years old GX P 2 Contraception: She took oral contraceptives for many years with no clotting complications Hysterectomy? yes BSO? 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  Her husband Cynthia Chase teaches physical education at the [employer].  Their children are Cynthia Chase, 7 years old  and Cynthia Chase 40 years old as of October 2022 as of October 2022.  Cynthia Chase just got a phone.  Cynthia Chase attends a [employer] locally    ADVANCED DIRECTIVES: In the absence of any documents to the contrary the patient's husband is her healthcare power of attorney   HEALTH MAINTENANCE: Social History   Tobacco Use   Smoking status: Never    Passive exposure: Never   Smokeless tobacco: Never   Tobacco comments:    Never smoked  Vaping Use   Vaping status: Never Used  Substance Use Topics   Alcohol use: Yes    Alcohol/week: 0.0 standard drinks of alcohol    Comment: occassional   Drug use: No     Colonoscopy: 2018/Dr. fields  PAP: s/p hysterectomy (previous: 2015, atypical)  Bone density: Never   Allergies  Allergen Reactions   Gentamicin     Eye drop form, caused itchy red eyes    Adhesive [Tape]     Band aids; casue redness    Sulfa Antibiotics  Rash    Current Outpatient Medications  Medication Sig Dispense Refill   clobetasol ointment (TEMOVATE) 0.05 % Apply 1 Application topically 2 (two) times daily. Between buttocks 30 g 0   Sodium Phosphates (FLEET ENEMA RE) Place rectally as needed.     No current facility-administered medications for this visit.    OBJECTIVE: white woman who appears well  Vitals:   02/04/23 0853  BP: 112/75  Pulse: 71  Resp: 16  Temp: (!) 97.2 F (36.2 C)  SpO2: 93%       Body mass index is 24.43 kg/m.   Wt Readings from Last 3 Encounters:  02/04/23 160 lb 11.2 oz (72.9 kg)  12/26/22 156 lb 3.2 oz (70.9 kg)  10/17/22 155 lb 3.2 oz (70.4 kg)     ECOG FS:0 - Asymptomatic  Physical Exam Constitutional:      Appearance: Normal appearance.  Chest:     Comments: Bilateral breasts inspected.  Scattered density noted mostly in the upper quadrants.  No palpable masses or regional adenopathy Musculoskeletal:        General: No swelling or tenderness.     Cervical back: Normal range of motion and neck supple. No rigidity.  Lymphadenopathy:      Cervical: No cervical adenopathy.  Skin:    Coloration: Skin is not jaundiced.  Neurological:     Mental Status: She is alert.     LAB RESULTS:  CMP     Component Value Date/Time   NA 139 08/25/2019 1350   K 3.6 08/25/2019 1350   CL 101 08/25/2019 1350   CO2 29 08/25/2019 1350   GLUCOSE 87 08/25/2019 1350   BUN 14 08/25/2019 1350   CREATININE 0.83 08/25/2019 1350   CALCIUM 9.3 08/25/2019 1350   PROT 7.1 08/25/2019 1350   ALBUMIN 4.1 08/25/2019 1350   AST 15 08/25/2019 1350   ALT 9 08/25/2019 1350   ALKPHOS 57 08/25/2019 1350   BILITOT 0.6 08/25/2019 1350   GFRNONAA >60 08/25/2019 1350   GFRAA >60 08/25/2019 1350    No results found for: "TOTALPROTELP", "ALBUMINELP", "A1GS", "A2GS", "BETS", "BETA2SER", "GAMS", "MSPIKE", "SPEI"  No results found for: "KPAFRELGTCHN", "LAMBDASER", "KAPLAMBRATIO"  Lab Results  Component Value Date   WBC 6.3 08/25/2019   NEUTROABS 4.1 08/25/2019   HGB 13.0 08/25/2019   HCT 38.5 08/25/2019   MCV 96.7 08/25/2019   PLT 192 08/25/2019   No results found for: "LABCA2"  No components found for: "EXBMWU132"  No results for input(s): "INR" in the last 168 hours.  No results found for: "LABCA2"  No results found for: "GMW102"  No results found for: "CAN125"  No results found for: "CAN153"  No results found for: "CA2729"  No components found for: "HGQUANT"  No results found for: "CEA1", "CEA" / No results found for: "CEA1", "CEA"   No results found for: "AFPTUMOR"  No results found for: "CHROMOGRNA"  No results found for: "HGBA", "HGBA2QUANT", "HGBFQUANT", "HGBSQUAN" (Hemoglobinopathy evaluation)   No results found for: "LDH"  No results found for: "IRON", "TIBC", "IRONPCTSAT" (Iron and TIBC)  No results found for: "FERRITIN"  Urinalysis    Component Value Date/Time   COLORURINE YELLOW 08/13/2010 2025   APPEARANCEUR CLEAR 08/13/2010 2025   LABSPEC <1.005 (L) 08/13/2010 2025   PHURINE 6.5 08/13/2010 2025   GLUCOSEU  NEGATIVE 08/13/2010 2025   HGBUR LARGE (A) 08/13/2010 2025   BILIRUBINUR NEGATIVE 08/13/2010 2025   KETONESUR NEGATIVE 08/13/2010 2025   PROTEINUR NEGATIVE 08/13/2010 2025   UROBILINOGEN  0.2 08/13/2010 2025   NITRITE NEGATIVE 08/13/2010 2025   LEUKOCYTESUR NEGATIVE 08/13/2010 2025    STUDIES: No results found.    ELIGIBLE FOR AVAILABLE RESEARCH PROTOCOL: no  ASSESSMENT: 42 y.o. [place] woman with a significant Chase history for breast and pancreatic cancer and a predicted lifetime risk of breast cancer of 22.6%  (1) took tamoxifen tamoxifen for a few months in 2020-21 with poor tolerance  (2) on intensified screening: Breast density category C  (a) mammography alternating with breast MRI every 6 months  (b) continue intensified screening onto breast density B or less  (c) biannual physician breast exam   PLAN:  Patient is here for follow-up. Since last visit, no new breast changes.  Last mammogram in Sep 2024 with no concerns, breast density category C. 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  Adjacent breast with PASH.  We will follow-up on the next MRI, I will also discuss with one of my colleagues from breast surgery to see if she needs a referral. I ordered MR breast for follow-up.  No concerns on exam today except for the heterogeneous density noted. Continue intensive screening, alternate mammograms with MRIs, next physical exam due in the spring with her GYN. With regards to chest pain during exercise, she will be seeing a cardiologist for further investigation. Encouraged continued physical activity, strength training, come back for 1 year visit in fall 2025. She will return to clinic in 1 year or sooner as needed.  Self breast exam recommended  Total time spent: 30 minutes  *Total Encounter Time as defined by the Centers  for Medicare and Medicaid Services includes, in addition to the face-to-face time of a patient visit (documented in the note above) non-face-to-face time: obtaining and reviewing outside history, ordering and reviewing medications, tests or procedures, care coordination (communications with other health care professionals or caregivers) and documentation in the medical record.

## 2023-02-18 ENCOUNTER — Ambulatory Visit: Payer: BC Managed Care – PPO | Attending: Internal Medicine | Admitting: Internal Medicine

## 2023-02-18 ENCOUNTER — Encounter: Payer: Self-pay | Admitting: Internal Medicine

## 2023-02-18 VITALS — BP 104/70 | HR 70 | Ht 68.0 in | Wt 160.4 lb

## 2023-02-18 DIAGNOSIS — R002 Palpitations: Secondary | ICD-10-CM | POA: Insufficient documentation

## 2023-02-18 DIAGNOSIS — R079 Chest pain, unspecified: Secondary | ICD-10-CM | POA: Insufficient documentation

## 2023-02-18 NOTE — Patient Instructions (Signed)
Medication Instructions:  Your physician recommends that you continue on your current medications as directed. Please refer to the Current Medication list given to you today.  *If you need a refill on your cardiac medications before your next appointment, please call your pharmacy*   Lab Work: None If you have labs (blood work) drawn today and your tests are completely normal, you will receive your results only by: MyChart Message (if you have MyChart) OR A paper copy in the mail If you have any lab test that is abnormal or we need to change your treatment, we will call you to review the results.   Testing/Procedures: Your physician has requested that you have an exercise tolerance test. For further information please visit [email or web address] Please also follow instruction sheet, as given.    Follow-Up: At [facility], you and your health needs are our priority.  As part of our continuing mission to provide you with exceptional heart care, we have created designated Provider Care Teams.  These Care Teams include your primary Cardiologist (physician) and Advanced Practice Providers (APPs -  Physician Assistants and Nurse Practitioners) who all work together to provide you with the care you need, when you need it.  We recommend signing up for the patient portal called "MyChart".  Sign up information is provided on this After Visit Summary.  MyChart is used to connect with patients for Virtual Visits (Telemedicine).  Patients are able to view lab/test results, encounter notes, upcoming appointments, etc.  Non-urgent messages can be sent to your provider as well.   To learn more about what you can do with MyChart, go to [email or web address].    Your next appointment:    Follow up is pending testing results  Provider:   Luane School, MD  Other Instructions

## 2023-02-18 NOTE — Progress Notes (Signed)
Cardiology Office Note  Date: 02/18/2023   ID: Cynthia Chase, Cynthia Chase [birth year], MRN [id number]  PCP:  Richardean Chimera, MD  Cardiologist:  None Electrophysiologist:  None   History of Present Illness: Cynthia Chase is a 42 y.o. female known to have Raynaud's disease was referred to cardiology clinic for evaluation of chest tightness during exertion.  METs more than 4, physically active at baseline, she noticed chest tightness while running around 2 to 3 weeks ago. She thought this was a pulled muscle initially and also gradually the chest tightness got better. However, she is little nervous about running long distances due to recurrence of chest tightness.  No other symptoms of DOE, orthopnea, PND, dizziness, syncope.  She does have some palpitations occurring once a week before going to bed.  Takes caffeinated products.  Past Medical History:  Diagnosis Date   Abnormal Pap smear    2006   Anemia    With pregnancy   Arthritis    Osteoarthritis; bilateral knees   Headache    Migraines   History of anal fissures    saw Dr Jonette Eva gastro for eval or rectal bleeding and lower abdominal pain, diagnostic colonoscopy 2017;  denies current abdomnal symptoms    PONV (postoperative nausea and vomiting)    does well with scopolamine    Raynaud disease    Right ovarian cyst    Shingles    3rd and 4th grade; denies flare-ups    Varicella    as a child   Wears glasses     Past Surgical History:  Procedure Laterality Date   ABDOMINAL HYSTERECTOMY     ADENOIDECTOMY     still has tonsils    ARTHROSCOPIC REPAIR ACL Right 1998, 2003   Dr Eugenia Mcalpine    ARTHROSCOPIC REPAIR ACL Left 2000   Dr Eugenia Mcalpine   BILATERAL SALPINGECTOMY Bilateral 01/03/2015   Procedure: BILATERAL SALPINGECTOMY;  Surgeon: Hal Morales, MD;  Location: [facility];  Service: Gynecology;  Laterality: Bilateral;   BUNIONECTOMY  2012   done by surgeon at [facility]( noe [facility]  ortho), patient does not remember name of surgeon    COLONOSCOPY N/A 08/04/2015   non-bleeding external and internal hemorrhoids, anal fissure   DILATION AND CURETTAGE OF UTERUS  2008   Dr [name] [facility]    KNEE SURGERY     LAPAROSCOPIC ASSISTED VAGINAL HYSTERECTOMY N/A 01/03/2015   Procedure: LAPAROSCOPIC ASSISTED VAGINAL HYSTERECTOMY;  Surgeon: Hal Morales, MD;  Location: [facility];  Service: Gynecology;  Laterality: N/A;   WISDOM TOOTH EXTRACTION      No current outpatient medications on file.   No current facility-administered medications for this visit.   Allergies:  Gentamicin, Adhesive [tape], and Sulfa antibiotics   Social History: The patient  reports that she has never smoked. She has never been exposed to tobacco smoke. She has never used smokeless tobacco. She reports current alcohol use. She reports that she does not use drugs.   Family History: The patient's family history includes Breast cancer in her cousin, paternal aunt, and paternal grandmother; Cancer in her maternal grandfather and mother; Colon polyps in her father; Hyperlipidemia in her mother.   ROS:  Please see the history of present illness. Otherwise, complete review of systems is positive for none.  All other systems are reviewed and negative.   Physical Exam: VS:  Ht 5\' 8"  (1.727 m)   Wt 160 lb 6.4 oz (72.8 kg)  LMP 11/18/2014 Comment: partial   BMI 24.39 kg/m , BMI Body mass index is 24.39 kg/m.  Wt Readings from Last 3 Encounters:  02/18/23 160 lb 6.4 oz (72.8 kg)  02/04/23 160 lb 11.2 oz (72.9 kg)  12/26/22 156 lb 3.2 oz (70.9 kg)    General: Patient appears comfortable at rest. HEENT: Conjunctiva and lids normal, oropharynx clear with moist mucosa. Neck: Supple, no elevated JVP or carotid bruits, no thyromegaly. Lungs: Clear to auscultation, nonlabored breathing at rest. Cardiac: Regular rate and rhythm, no S3 or significant systolic murmur, no pericardial rub. Abdomen: Soft,  nontender, no hepatomegaly, bowel sounds present, no guarding or rebound. Extremities: No pitting edema, distal pulses 2+. Skin: Warm and dry. Musculoskeletal: No kyphosis. Neuropsychiatric: Alert and oriented x3, affect grossly appropriate.  Recent Labwork: No results found for requested labs within last 365 days.  No results found for: "CHOL", "TRIG", "HDL", "CHOLHDL", "VLDL", "LDLCALC", "LDLDIRECT"   Assessment and Plan:  Chest tightness: Around 2 to 3 weeks ago, chest tightness occurred during running.  This was thought to be a pulled muscle and the pain eventually got better.  But she is little nervous about running long distance due to fear of recurrence of chest tightness.  Will obtain exercise tolerance test.  Palpitations: Occurs at night, once per week.  Takes caffeinated products.  If palpitations gets worse, will need 2-week event monitor.  She will also need to cut back on the caffeinated products before reaching out to [place] for the event monitor.   I spent a total duration 30 minutes discussing about chest pain and palpitations, ordering test and documenting the findings in the note.  Medication Adjustments/Labs and Tests Ordered: Current medicines are reviewed at length with the patient today.  Concerns regarding medicines are outlined above.    Disposition:  Follow up pending results  Signed, Shanikka Wonders Verne Spurr, MD, 02/18/2023 3:52 PM    [facility] at [facility] [address] [place], [place] [address]

## 2023-02-25 DIAGNOSIS — R41840 Attention and concentration deficit: Secondary | ICD-10-CM | POA: Diagnosis not present

## 2023-02-25 DIAGNOSIS — F988 Other specified behavioral and emotional disorders with onset usually occurring in childhood and adolescence: Secondary | ICD-10-CM | POA: Diagnosis not present

## 2023-02-25 DIAGNOSIS — Z6824 Body mass index (BMI) 24.0-24.9, adult: Secondary | ICD-10-CM | POA: Diagnosis not present

## 2023-02-28 ENCOUNTER — Ambulatory Visit (HOSPITAL_COMMUNITY)
Admission: RE | Admit: 2023-02-28 | Discharge: 2023-02-28 | Disposition: A | Discharge status: Home / Self Care | Payer: BC Managed Care – PPO | Source: Ambulatory Visit | Attending: Family Medicine | Admitting: Family Medicine

## 2023-02-28 DIAGNOSIS — R079 Chest pain, unspecified: Secondary | ICD-10-CM

## 2023-02-28 LAB — EXERCISE TOLERANCE TEST
Angina Index: 0
Duke Treadmill Score: 11
Estimated workload: 13.7
Exercise duration (min): 11 min
Exercise duration (sec): 11 s
MPHR: 180 {beats}/min
Peak HR: 181 {beats}/min
Percent HR: 100 %
RPE: 16
Rest HR: 89 {beats}/min
ST Depression (mm): 0 mm

## 2023-04-01 DIAGNOSIS — F988 Other specified behavioral and emotional disorders with onset usually occurring in childhood and adolescence: Secondary | ICD-10-CM | POA: Diagnosis not present

## 2023-04-01 DIAGNOSIS — Z6824 Body mass index (BMI) 24.0-24.9, adult: Secondary | ICD-10-CM | POA: Diagnosis not present

## 2023-04-01 DIAGNOSIS — R41840 Attention and concentration deficit: Secondary | ICD-10-CM | POA: Diagnosis not present

## 2023-05-13 ENCOUNTER — Other Ambulatory Visit: Payer: BC Managed Care – PPO

## 2023-05-19 DIAGNOSIS — F988 Other specified behavioral and emotional disorders with onset usually occurring in childhood and adolescence: Secondary | ICD-10-CM | POA: Diagnosis not present

## 2023-05-19 DIAGNOSIS — Z6824 Body mass index (BMI) 24.0-24.9, adult: Secondary | ICD-10-CM | POA: Diagnosis not present

## 2023-05-19 DIAGNOSIS — J019 Acute sinusitis, unspecified: Secondary | ICD-10-CM | POA: Diagnosis not present

## 2023-05-26 ENCOUNTER — Ambulatory Visit
Admission: RE | Admit: 2023-05-26 | Discharge: 2023-05-26 | Disposition: A | Discharge status: Home / Self Care | Payer: BC Managed Care – PPO | Source: Ambulatory Visit | Attending: Hematology and Oncology

## 2023-05-26 DIAGNOSIS — Z1239 Encounter for other screening for malignant neoplasm of breast: Secondary | ICD-10-CM

## 2023-05-26 MED ORDER — GADOPICLENOL 0.5 MMOL/ML IV SOLN
7.0000 mL | Freq: Once | INTRAVENOUS | Status: AC | PRN
  Administered 2023-05-26: 7 mL via INTRAVENOUS

## 2023-06-02 DIAGNOSIS — G43809 Other migraine, not intractable, without status migrainosus: Secondary | ICD-10-CM | POA: Diagnosis not present

## 2023-06-12 DIAGNOSIS — L401 Generalized pustular psoriasis: Secondary | ICD-10-CM | POA: Diagnosis not present

## 2023-06-12 DIAGNOSIS — I7300 Raynaud's syndrome without gangrene: Secondary | ICD-10-CM | POA: Diagnosis not present

## 2023-06-29 DIAGNOSIS — M25572 Pain in left ankle and joints of left foot: Secondary | ICD-10-CM | POA: Diagnosis not present

## 2023-10-22 DIAGNOSIS — R1032 Left lower quadrant pain: Secondary | ICD-10-CM | POA: Diagnosis not present

## 2023-10-27 DIAGNOSIS — D2372 Other benign neoplasm of skin of left lower limb, including hip: Secondary | ICD-10-CM | POA: Diagnosis not present

## 2023-10-27 DIAGNOSIS — D1801 Hemangioma of skin and subcutaneous tissue: Secondary | ICD-10-CM | POA: Diagnosis not present

## 2023-10-27 DIAGNOSIS — L813 Cafe au lait spots: Secondary | ICD-10-CM | POA: Diagnosis not present

## 2023-10-27 DIAGNOSIS — D2371 Other benign neoplasm of skin of right lower limb, including hip: Secondary | ICD-10-CM | POA: Diagnosis not present

## 2023-12-02 DIAGNOSIS — M79644 Pain in right finger(s): Secondary | ICD-10-CM | POA: Diagnosis not present

## 2023-12-19 DIAGNOSIS — S63641A Sprain of metacarpophalangeal joint of right thumb, initial encounter: Secondary | ICD-10-CM | POA: Diagnosis not present

## 2024-01-27 DIAGNOSIS — Z6825 Body mass index (BMI) 25.0-25.9, adult: Secondary | ICD-10-CM | POA: Diagnosis not present

## 2024-01-27 DIAGNOSIS — Z0000 Encounter for general adult medical examination without abnormal findings: Secondary | ICD-10-CM | POA: Diagnosis not present

## 2024-02-04 ENCOUNTER — Telehealth: Payer: Self-pay

## 2024-02-04 NOTE — Telephone Encounter (Signed)
 Spoke with patient and confirmed appointment on 11/13

## 2024-02-05 ENCOUNTER — Inpatient Hospital Stay: Payer: BC Managed Care – PPO | Attending: Hematology and Oncology | Admitting: Hematology and Oncology

## 2024-02-05 ENCOUNTER — Inpatient Hospital Stay

## 2024-02-05 VITALS — BP 102/65 | HR 67 | Temp 98.2°F | Resp 17 | Wt 162.6 lb

## 2024-02-05 DIAGNOSIS — Z803 Family history of malignant neoplasm of breast: Secondary | ICD-10-CM | POA: Diagnosis not present

## 2024-02-05 DIAGNOSIS — R635 Abnormal weight gain: Secondary | ICD-10-CM | POA: Insufficient documentation

## 2024-02-05 DIAGNOSIS — I7300 Raynaud's syndrome without gangrene: Secondary | ICD-10-CM | POA: Diagnosis not present

## 2024-02-05 DIAGNOSIS — Z800 Family history of malignant neoplasm of digestive organs: Secondary | ICD-10-CM | POA: Diagnosis not present

## 2024-02-05 DIAGNOSIS — Z9189 Other specified personal risk factors, not elsewhere classified: Secondary | ICD-10-CM | POA: Diagnosis present

## 2024-02-05 DIAGNOSIS — Z801 Family history of malignant neoplasm of trachea, bronchus and lung: Secondary | ICD-10-CM | POA: Insufficient documentation

## 2024-02-05 DIAGNOSIS — Z90710 Acquired absence of both cervix and uterus: Secondary | ICD-10-CM | POA: Diagnosis not present

## 2024-02-05 DIAGNOSIS — N644 Mastodynia: Secondary | ICD-10-CM | POA: Diagnosis not present

## 2024-02-05 DIAGNOSIS — N643 Galactorrhea not associated with childbirth: Secondary | ICD-10-CM | POA: Diagnosis not present

## 2024-02-05 DIAGNOSIS — Z1239 Encounter for other screening for malignant neoplasm of breast: Secondary | ICD-10-CM

## 2024-02-05 DIAGNOSIS — Z90721 Acquired absence of ovaries, unilateral: Secondary | ICD-10-CM | POA: Insufficient documentation

## 2024-02-05 LAB — T4, FREE: Free T4: 0.85 ng/dL (ref 0.61–1.12)

## 2024-02-05 LAB — TSH: TSH: 0.822 u[IU]/mL (ref 0.350–4.500)

## 2024-02-05 NOTE — Progress Notes (Signed)
 [facility]  [phone] [phone]     ID: Cynthia Chase DOB: Jan 09, 1981  MR#: [id number]  RDW#:[id number]  Patient Care Team: Toribio Jerel MATSU, MD as PCP - General (Family Medicine) Harvey Margo CROME, MD (Inactive) as Consulting Physician (Gastroenterology) Serene Gaile ORN, MD as Consulting Physician (Vascular Surgery) Connye Fess, MD (Inactive) as Consulting Physician (Radiology) Amber Stalls, MD OTHER MD: Dr. Shanda Muscat (OBGYN)  CHIEF COMPLAINT: high risk for breast cancer  CURRENT TREATMENT: Intensified screening   COVID 19 VACCINATION STATUS: Had Covid September 2021.  Had Moderna x1 by October 2020  HISTORY OF CURRENT ILLNESS: From the original intake note:  Cynthia Chase has a family history of breast cancer, detailed below.  It was recommended she start mammography early, and she had her first screening mammography on 12/03/2018 showing a possible abnormality in the right breast. She underwent right diagnostic mammography with tomography and ultrasonography at [facility] on 12/07/2018 showing: breast density category C; benign cyst within the right breast at the 10 o'clock axis, measuring 7 mm, corresponding to the mammographic finding.  She was referred to the high-risk clinic for further discussion of risk management. She couldn't tolerate tamoxifen . Her subsequent history is as detailed below.  Discussed the use of AI scribe software for clinical note transcription with the patient, who gave verbal consent to proceed.  History of Present Illness Cynthia Chase is a 43 year old female who presents with breast soreness and discharge.  She experiences significant breast soreness and engorgement, describing it as 'super sore' and painful to touch. This has occurred twice this year, with the most recent episode a couple of weeks ago. She likens the sensation to post-pregnancy engorgement.  There is a  discharge from both breasts, described as a yellowish and sometimes brownish fluid. This discharge has been occurring periodically over the past thirteen years since her last childbirth. It is not bloody and does not squirt out forcefully. It is more milk like.  She has a history of Raynaud's phenomenon and has been experiencing hormonal changes recently. No headaches, menstrual irregularities, or pregnancy, as she has had a hysterectomy with one ovary remaining. She has also noted recent weight gain without a clear cause.  Her past medical history includes difficulty breastfeeding due to low milk production.   PAST MEDICAL HISTORY: Past Medical History:  Diagnosis Date   Abnormal Pap smear    2006   Anemia    With pregnancy   Arthritis    Osteoarthritis; bilateral knees   Headache    Migraines   History of anal fissures    saw Dr Margo Harvey gastro for eval or rectal bleeding and lower abdominal pain, diagnostic colonoscopy 2017;  denies current abdomnal symptoms    PONV (postoperative nausea and vomiting)    does well with scopolamine     Raynaud disease    Right ovarian cyst    Shingles    3rd and 4th grade; denies flare-ups    Varicella    as a child   Wears glasses     PAST SURGICAL HISTORY: Past Surgical History:  Procedure Laterality Date   ABDOMINAL HYSTERECTOMY     ADENOIDECTOMY     still has tonsils    ARTHROSCOPIC REPAIR ACL Right 1998, 2003   Dr Lamar Collet    ARTHROSCOPIC REPAIR ACL Left 2000   Dr Lamar Collet   BILATERAL SALPINGECTOMY Bilateral 01/03/2015   Procedure: BILATERAL SALPINGECTOMY;  Surgeon: Shanda SHAUNNA Muscat,  MD;  Location: [facility];  Service: Gynecology;  Laterality: Bilateral;   BUNIONECTOMY  2012   done by surgeon at [facility]( noe [facility]), patient does not remember name of surgeon    COLONOSCOPY N/A 08/04/2015   non-bleeding external and internal hemorrhoids, anal fissure   DILATION AND CURETTAGE OF UTERUS  2008   Dr Darcel  at [facility]    KNEE SURGERY     LAPAROSCOPIC ASSISTED VAGINAL HYSTERECTOMY N/A 01/03/2015   Procedure: LAPAROSCOPIC ASSISTED VAGINAL HYSTERECTOMY;  Surgeon: Shanda SHAUNNA Muscat, MD;  Location: [facility];  Service: Gynecology;  Laterality: N/A;   WISDOM TOOTH EXTRACTION      FAMILY HISTORY: Family History  Problem Relation Age of Onset   Hyperlipidemia Mother    Cancer Mother    Cancer Maternal Grandfather    Colon polyps Father    Breast cancer Paternal Aunt    Breast cancer Paternal Grandmother    Breast cancer Cousin    Colon cancer Neg Hx    Ulcerative colitis Neg Hx    Crohn's disease Neg Hx   The patient's father is 73 year old as of October 2020.  He has 1 sister, who had breast cancer, unknown age of initial diagnosis.  That sister has a daughter, the patient's first cousin, also with breast cancer, diagnosed before the age of 13.  The patient's father had 1 brother who died in his 80s from lung cancer in the setting of tobacco abuse.  The patient's father's mother had breast cancer, age of diagnosis unknown.  The patient's father's father had pancreatic cancer and his nephew, the patient is father's brother son, also had pancreatic cancer.  On the maternal side the patient's mother is 32 years old as of October 2020.  The maternal grandfather had carcinoid.  There is no history of breast ovarian or pancreatic cancer on the maternal side of the family.  The patient herself has 1 brother, no sisters, with no history of cancer.     GYNECOLOGIC HISTORY:  Patient's last menstrual period was 11/18/2014. Menarche: 43 years old Age at first live birth: 43 years old GX P 2 Contraception: She took oral contraceptives for many years with no clotting complications Hysterectomy? yes BSO? 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  Her husband Eva teaches physical education at the [employer].  Their children are Maurilio, 34 years old and Hunter 41 years old as of October 2022.  Katrinka just got a phone.  Cynthia Chase attends a [employer] locally    ADVANCED DIRECTIVES: In the absence of any documents to the contrary the patient's husband is her healthcare power of attorney   HEALTH MAINTENANCE: Social History   Tobacco Use   Smoking status: Never    Passive exposure: Never   Smokeless tobacco: Never   Tobacco comments:    Never smoked  Vaping Use   Vaping status: Never Used  Substance Use Topics   Alcohol use: Yes    Alcohol/week: 0.0 standard drinks of alcohol    Comment: occassional   Drug use: No     Colonoscopy: 2018/Dr. fields  PAP: s/p hysterectomy (previous: 2015, atypical)  Bone density: Never   Allergies  Allergen Reactions   Gentamicin     Eye drop form, caused itchy red eyes  Adhesive [Tape]     Band aids; casue redness    Sulfa Antibiotics Rash    Current Outpatient Medications  Medication Sig Dispense Refill   BIOTIN PO Take by mouth daily.     No current facility-administered medications for this visit.    OBJECTIVE: white woman who appears well  There were no vitals filed for this visit.      There is no height or weight on file to calculate BMI.   Wt Readings from Last 3 Encounters:  02/18/23 160 lb 6.4 oz (72.8 kg)  02/04/23 160 lb 11.2 oz (72.9 kg)  12/26/22 156 lb 3.2 oz (70.9 kg)     ECOG FS:0 - Asymptomatic  Physical Exam Constitutional:      Appearance: Normal appearance.  Chest:     Comments: Bilateral breasts inspected.  Scattered density noted mostly in the upper quadrants.  No palpable masses or regional adenopathy Musculoskeletal:        General: No swelling or tenderness.     Cervical back: Normal range of motion and neck supple. No rigidity.  Lymphadenopathy:      Cervical: No cervical adenopathy.  Skin:    Coloration: Skin is not jaundiced.  Neurological:     Mental Status: She is alert.     LAB RESULTS:  CMP     Component Value Date/Time   NA 139 08/25/2019 1350   K 3.6 08/25/2019 1350   CL 101 08/25/2019 1350   CO2 29 08/25/2019 1350   GLUCOSE 87 08/25/2019 1350   BUN 14 08/25/2019 1350   CREATININE 0.83 08/25/2019 1350   CALCIUM 9.3 08/25/2019 1350   PROT 7.1 08/25/2019 1350   ALBUMIN 4.1 08/25/2019 1350   AST 15 08/25/2019 1350   ALT 9 08/25/2019 1350   ALKPHOS 57 08/25/2019 1350   BILITOT 0.6 08/25/2019 1350   GFRNONAA >60 08/25/2019 1350   GFRAA >60 08/25/2019 1350    No results found for: TOTALPROTELP, ALBUMINELP, A1GS, A2GS, BETS, BETA2SER, GAMS, MSPIKE, SPEI  No results found for: KPAFRELGTCHN, LAMBDASER, KAPLAMBRATIO  Lab Results  Component Value Date   WBC 6.3 08/25/2019   NEUTROABS 4.1 08/25/2019   HGB 13.0 08/25/2019   HCT 38.5 08/25/2019   MCV 96.7 08/25/2019   PLT 192 08/25/2019   No results found for: LABCA2  No components found for: [id number]  No results for input(s): INR in the last 168 hours.  No results found for: LABCA2  No results found for: RJW800  No results found for: CAN125  No results found for: CAN153  No results found for: CA2729  No components found for: HGQUANT  No results found for: CEA1, CEA / No results found for: CEA1, CEA   No results found for: AFPTUMOR  No results found for: CHROMOGRNA  No results found for: HGBA, HGBA2QUANT, HGBFQUANT, HGBSQUAN (Hemoglobinopathy evaluation)   No results found for: LDH  No results found for: IRON, TIBC, IRONPCTSAT (Iron and TIBC)  No results found for: FERRITIN  Urinalysis    Component Value Date/Time   COLORURINE YELLOW 08/13/2010 2025   APPEARANCEUR CLEAR 08/13/2010 2025   LABSPEC <1.005 (L) 08/13/2010 2025   PHURINE 6.5 08/13/2010 2025   GLUCOSEU  NEGATIVE 08/13/2010 2025   HGBUR LARGE (A) 08/13/2010 2025   BILIRUBINUR NEGATIVE 08/13/2010 2025   KETONESUR NEGATIVE 08/13/2010 2025   PROTEINUR NEGATIVE 08/13/2010 2025   UROBILINOGEN 0.2 08/13/2010 2025   NITRITE NEGATIVE 08/13/2010 2025   LEUKOCYTESUR NEGATIVE 08/13/2010 2025    STUDIES: No  results found.    ELIGIBLE FOR AVAILABLE RESEARCH PROTOCOL: no  ASSESSMENT: 43 y.o. [place] woman with a significant family history for breast and pancreatic cancer and a predicted lifetime risk of breast cancer of 22.6%  (1) took tamoxifen  tamoxifen  for a few months in 2020-21 with poor tolerance  (2) on intensified screening: Breast density category C  (a) mammography alternating with breast MRI every 6 months  (b) continue intensified screening onto breast density B or less  (c) biannual physician breast exam   PLAN:  Assessment and Plan Assessment & Plan Breast cancer screening Mammogram overdue. Previous imaging normal. - continue mammogram alternating with MRI for intensified breast cancer screening.  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 Non-bloody, bilateral discharge suggests physiological cause. Differential includes prolactinoma. No headaches or menstrual irregularities. Consider thyroid dysfunction. - Ordered serum prolactin level. - Ordered thyroid function test. - Advised discussion with primary care physician for potential brain MRI. - Consider endocrinologist referral if prolactin elevated.  Total time spent: 30 minutes  *Total Encounter Time as defined by the Centers for Medicare and Medicaid Services includes, in addition to the face-to-face time of a patient visit (documented in the note above) non-face-to-face time: obtaining and reviewing outside history, ordering and reviewing medications, tests or procedures, care coordination (communications with other  health care professionals or caregivers) and documentation in the medical record.

## 2024-02-06 LAB — PROLACTIN: Prolactin: 6.4 ng/mL (ref 4.8–33.4)

## 2024-02-12 DIAGNOSIS — Z1321 Encounter for screening for nutritional disorder: Secondary | ICD-10-CM | POA: Diagnosis not present

## 2024-02-12 DIAGNOSIS — Z1389 Encounter for screening for other disorder: Secondary | ICD-10-CM | POA: Diagnosis not present

## 2024-02-12 DIAGNOSIS — Z13220 Encounter for screening for lipoid disorders: Secondary | ICD-10-CM | POA: Diagnosis not present

## 2024-02-24 ENCOUNTER — Inpatient Hospital Stay: Admission: RE | Admit: 2024-02-24 | Discharge: 2024-02-24 | Attending: Hematology and Oncology

## 2024-02-24 DIAGNOSIS — Z1239 Encounter for other screening for malignant neoplasm of breast: Secondary | ICD-10-CM

## 2024-02-24 DIAGNOSIS — Z1231 Encounter for screening mammogram for malignant neoplasm of breast: Secondary | ICD-10-CM | POA: Diagnosis not present

## 2024-03-03 DIAGNOSIS — D485 Neoplasm of uncertain behavior of skin: Secondary | ICD-10-CM | POA: Diagnosis not present

## 2024-03-03 DIAGNOSIS — D2372 Other benign neoplasm of skin of left lower limb, including hip: Secondary | ICD-10-CM | POA: Diagnosis not present

## 2024-03-03 DIAGNOSIS — B078 Other viral warts: Secondary | ICD-10-CM | POA: Diagnosis not present

## 2024-03-03 DIAGNOSIS — D2371 Other benign neoplasm of skin of right lower limb, including hip: Secondary | ICD-10-CM | POA: Diagnosis not present

## 2025-02-04 ENCOUNTER — Inpatient Hospital Stay: Admitting: Hematology and Oncology
